# Patient Record
Sex: Male | Born: 1970 | Race: Black or African American | Hispanic: No | Smoking: Never smoker
Health system: Southern US, Community
[De-identification: ages and names within clinical notes are randomized; demographics above are authoritative.]

## PROBLEM LIST (undated history)

## (undated) DIAGNOSIS — K5792 Diverticulitis of intestine, part unspecified, without perforation or abscess without bleeding: Secondary | ICD-10-CM

## (undated) DIAGNOSIS — G4730 Sleep apnea, unspecified: Secondary | ICD-10-CM

## (undated) DIAGNOSIS — T7840XA Allergy, unspecified, initial encounter: Secondary | ICD-10-CM

## (undated) DIAGNOSIS — I10 Essential (primary) hypertension: Secondary | ICD-10-CM

## (undated) DIAGNOSIS — H269 Unspecified cataract: Secondary | ICD-10-CM

## (undated) DIAGNOSIS — E785 Hyperlipidemia, unspecified: Secondary | ICD-10-CM

## (undated) DIAGNOSIS — E669 Obesity, unspecified: Secondary | ICD-10-CM

## (undated) HISTORY — DX: Essential (primary) hypertension: I10

## (undated) HISTORY — DX: Allergy, unspecified, initial encounter: T78.40XA

## (undated) HISTORY — DX: Hyperlipidemia, unspecified: E78.5

## (undated) HISTORY — DX: Diverticulitis of intestine, part unspecified, without perforation or abscess without bleeding: K57.92

## (undated) HISTORY — DX: Unspecified cataract: H26.9

## (undated) HISTORY — DX: Sleep apnea, unspecified: G47.30

## (undated) HISTORY — DX: Obesity, unspecified: E66.9

---

## 1998-05-19 ENCOUNTER — Emergency Department (HOSPITAL_COMMUNITY): Admission: EM | Admit: 1998-05-19 | Discharge: 1998-05-19 | Payer: Self-pay

## 1998-11-02 ENCOUNTER — Emergency Department (HOSPITAL_COMMUNITY): Admission: EM | Admit: 1998-11-02 | Discharge: 1998-11-02 | Payer: Self-pay | Admitting: Emergency Medicine

## 2000-05-01 ENCOUNTER — Emergency Department (HOSPITAL_COMMUNITY): Admission: EM | Admit: 2000-05-01 | Discharge: 2000-05-01 | Payer: Self-pay | Admitting: Emergency Medicine

## 2002-04-27 ENCOUNTER — Emergency Department (HOSPITAL_COMMUNITY): Admission: EM | Admit: 2002-04-27 | Discharge: 2002-04-28 | Payer: Self-pay | Admitting: Emergency Medicine

## 2003-10-20 ENCOUNTER — Emergency Department (HOSPITAL_COMMUNITY): Admission: EM | Admit: 2003-10-20 | Discharge: 2003-10-20 | Payer: Self-pay

## 2005-08-30 ENCOUNTER — Emergency Department (HOSPITAL_COMMUNITY): Admission: EM | Admit: 2005-08-30 | Discharge: 2005-08-30 | Payer: Self-pay | Admitting: Emergency Medicine

## 2006-08-05 ENCOUNTER — Emergency Department (HOSPITAL_COMMUNITY): Admission: EM | Admit: 2006-08-05 | Discharge: 2006-08-06 | Payer: Self-pay | Admitting: Emergency Medicine

## 2008-02-16 ENCOUNTER — Emergency Department (HOSPITAL_COMMUNITY): Admission: EM | Admit: 2008-02-16 | Discharge: 2008-02-16 | Payer: Self-pay | Admitting: Emergency Medicine

## 2010-02-04 ENCOUNTER — Ambulatory Visit: Payer: Self-pay | Admitting: Gastroenterology

## 2011-05-06 LAB — GC/CHLAMYDIA PROBE AMP, GENITAL
Chlamydia, DNA Probe: NEGATIVE
GC Probe Amp, Genital: POSITIVE — AB

## 2011-07-31 ENCOUNTER — Encounter: Payer: Self-pay | Admitting: *Deleted

## 2011-07-31 ENCOUNTER — Emergency Department (HOSPITAL_COMMUNITY)
Admission: EM | Admit: 2011-07-31 | Discharge: 2011-08-01 | Disposition: A | Discharge status: Home / Self Care | Payer: Self-pay | Attending: Emergency Medicine | Admitting: Emergency Medicine

## 2011-07-31 ENCOUNTER — Emergency Department (HOSPITAL_COMMUNITY): Payer: Self-pay

## 2011-07-31 DIAGNOSIS — K5732 Diverticulitis of large intestine without perforation or abscess without bleeding: Secondary | ICD-10-CM | POA: Insufficient documentation

## 2011-07-31 DIAGNOSIS — R10819 Abdominal tenderness, unspecified site: Secondary | ICD-10-CM | POA: Insufficient documentation

## 2011-07-31 DIAGNOSIS — K5792 Diverticulitis of intestine, part unspecified, without perforation or abscess without bleeding: Secondary | ICD-10-CM

## 2011-07-31 DIAGNOSIS — R1032 Left lower quadrant pain: Secondary | ICD-10-CM | POA: Insufficient documentation

## 2011-07-31 LAB — COMPREHENSIVE METABOLIC PANEL
Alkaline Phosphatase: 66 U/L (ref 39–117)
BUN: 16 mg/dL (ref 6–23)
CO2: 30 mEq/L (ref 19–32)
GFR calc Af Amer: 82 mL/min — ABNORMAL LOW (ref >90)
GFR calc non Af Amer: 71 mL/min — ABNORMAL LOW (ref >90)
Glucose, Bld: 93 mg/dL (ref 70–99)
Total Bilirubin: 0.9 mg/dL (ref 0.3–1.2)
Total Protein: 7.9 g/dL (ref 6.0–8.3)

## 2011-07-31 LAB — DIFFERENTIAL
Basophils Relative: 0 % (ref 0–1)
Eosinophils Absolute: 0.2 10*3/uL (ref 0.0–0.7)
Eosinophils Relative: 2 % (ref 0–5)
Lymphocytes Relative: 15 % (ref 12–46)
Neutro Abs: 8.5 10*3/uL — ABNORMAL HIGH (ref 1.7–7.7)

## 2011-07-31 LAB — CBC
HCT: 40.8 % (ref 39.0–52.0)
MCHC: 32.6 g/dL (ref 30.0–36.0)
MCV: 87 fL (ref 78.0–100.0)
RBC: 4.69 MIL/uL (ref 4.22–5.81)
RDW: 13.5 % (ref 11.5–15.5)

## 2011-07-31 LAB — URINALYSIS, ROUTINE W REFLEX MICROSCOPIC
Bilirubin Urine: NEGATIVE
Hgb urine dipstick: NEGATIVE
Leukocytes, UA: NEGATIVE
Nitrite: NEGATIVE
Urobilinogen, UA: 1 mg/dL (ref 0.0–1.0)

## 2011-07-31 LAB — LIPASE, BLOOD: Lipase: 29 U/L (ref 11–59)

## 2011-07-31 MED ORDER — IOHEXOL 300 MG/ML  SOLN: 100.0000 mL | Freq: Once | INTRAMUSCULAR | Status: AC | PRN

## 2011-07-31 MED ORDER — MORPHINE SULFATE 4 MG/ML IJ SOLN
4.0000 mg | Freq: Once | INTRAMUSCULAR | Status: AC
  Filled 2011-07-31: qty 1

## 2011-07-31 NOTE — ED Notes (Signed)
Pt noticed a gas like pain in his LLQ yesterday which has gotten progressively worse throughout the night and into today.  Pt is stating that his stool is more soft than usual.  Pt has tenderness upon mild palpation of LLQ and states that the pain is the worst in his life.

## 2011-07-31 NOTE — ED Provider Notes (Signed)
History     CSN: [id number]  Arrival date & time 07/31/11  4098   First MD Initiated Contact with Patient 07/31/11 2135      Chief Complaint  Patient presents with  . Abdominal Pain    LLQ, tender to palpation.    (Consider location/radiation/quality/duration/timing/severity/associated sxs/prior treatment) HPI Comments: Patient reports that he began having LLQ and LUQ pain yesterday morning.  The pain has gotten progressively worse.  He describes the pain as a constant sharp pain.  He reports that the pain is worse when he moves and when he ambulates.  No fever.  He reports that his last BM was at approximately 5pm this evening.  Pain did not improve after bowel movement.  He denies diarrhea, but states that his bowel movements have been softer than usual.    Patient is a 40 y.o. male presenting with abdominal pain.  Abdominal Pain The primary symptoms of the illness include abdominal pain, diarrhea and hematochezia. The primary symptoms of the illness do not include fever, shortness of breath, nausea, vomiting, hematemesis or dysuria.  Additional symptoms associated with the illness include chills. Symptoms associated with the illness do not include anorexia, diaphoresis, heartburn, constipation, urgency, hematuria, frequency or back pain.    History reviewed. No pertinent past medical history.  History reviewed. No pertinent past surgical history.  History reviewed. No pertinent family history.  History  Substance Use Topics  . Smoking status: Not on file  . Smokeless tobacco: Not on file  . Alcohol Use: 4.2 oz/week    3 Cans of beer, 4 Shots of liquor per week      Review of Systems  Constitutional: Positive for chills. Negative for fever and diaphoresis.  HENT: Negative for neck pain and neck stiffness.   Respiratory: Negative for chest tightness and shortness of breath.   Cardiovascular: Negative for chest pain.  Gastrointestinal: Positive for abdominal pain,  diarrhea, blood in stool and hematochezia. Negative for heartburn, nausea, vomiting, constipation, anorexia and hematemesis.  Genitourinary: Negative for dysuria, urgency, frequency and hematuria.  Musculoskeletal: Negative for back pain.  Skin: Negative for rash.  Neurological: Negative for dizziness, seizures, syncope, light-headedness and headaches.    Allergies  Review of patient's allergies indicates no known allergies.  Home Medications   Current Outpatient Rx  Name Route Sig Dispense Refill  . CIPROFLOXACIN HCL 500 MG PO TABS Oral Take 1 tablet (500 mg total) by mouth 2 (two) times daily. 28 tablet 0  . HYDROCODONE-ACETAMINOPHEN 5-325 MG PO TABS Oral Take 2 tablets by mouth every 4 (four) hours as needed for pain. 10 tablet 0  . METRONIDAZOLE 500 MG PO TABS Oral Take 1 tablet (500 mg total) by mouth 3 (three) times daily. 42 tablet 0    BP 140/72  Pulse 70  Temp(Src) 98.8 F (37.1 C) (Oral)  Resp 18  SpO2 99%  Physical Exam  Nursing note and vitals reviewed. Constitutional: He is oriented to person, place, and time. He appears well-developed and well-nourished. No distress.  HENT:  Head: Normocephalic and atraumatic.  Neck: Normal range of motion. Neck supple.  Cardiovascular: Normal rate, regular rhythm and normal heart sounds.   Pulmonary/Chest: Effort normal and breath sounds normal. No respiratory distress. He has no wheezes.  Abdominal: He exhibits no mass. There is tenderness in the left upper quadrant and left lower quadrant. There is no rebound, no guarding and no CVA tenderness.       Tenderness of the LLQ when palpating the RLQ  Neurological: He is alert and oriented to person, place, and time.  Skin: Skin is warm and dry. He is not diaphoretic.  Psychiatric: He has a normal mood and affect.    ED Course  Procedures (including critical care time)  Labs Reviewed  CBC - Abnormal; Notable for the following:    WBC 11.3 (*)    All other components within  normal limits  DIFFERENTIAL - Abnormal; Notable for the following:    Neutro Abs 8.5 (*)    All other components within normal limits  COMPREHENSIVE METABOLIC PANEL - Abnormal; Notable for the following:    GFR calc non Af Amer 71 (*)    GFR calc Af Amer 82 (*)    All other components within normal limits  URINALYSIS, ROUTINE W REFLEX MICROSCOPIC - Abnormal; Notable for the following:    Ketones, ur TRACE (*)    All other components within normal limits  LIPASE, BLOOD  LAB REPORT - SCANNED  SPECIMEN HOLD   No results found.   1. Diverticulitis       MDM  Patient afebrile.  VSS.  CT scan showing diverticulitis.  No perforation or abscess.  Therefore, feel that patient can be discharged home with outpatient antibiotic therapy.  Patient given Rx for Metronidazole and Cipro.        Pascal Lux [facility] 08/03/11 1345

## 2011-08-01 MED ORDER — IOHEXOL 300 MG/ML  SOLN
100.0000 mL | Freq: Once | INTRAMUSCULAR | Status: AC | PRN
  Administered 2011-08-01: 100 mL via INTRAVENOUS

## 2011-08-01 MED ORDER — CIPROFLOXACIN HCL 500 MG PO TABS: 500.0000 mg | ORAL_TABLET | Freq: Two times a day (BID) | ORAL | Status: AC

## 2011-08-01 MED ORDER — HYDROCODONE-ACETAMINOPHEN 5-325 MG PO TABS: 2.0000 | ORAL_TABLET | ORAL | Status: AC | PRN

## 2011-08-01 MED ORDER — METRONIDAZOLE 500 MG PO TABS: 500.0000 mg | ORAL_TABLET | Freq: Three times a day (TID) | ORAL | Status: AC

## 2011-08-03 NOTE — ED Provider Notes (Signed)
Medical screening examination/treatment/procedure(s) were performed by non-physician practitioner and as supervising physician I was immediately available for consultation/collaboration.   Geraldin Habermehl A Margaret Staggs, MD 08/03/11 2325 

## 2012-02-11 ENCOUNTER — Emergency Department (HOSPITAL_COMMUNITY)
Admission: EM | Admit: 2012-02-11 | Discharge: 2012-02-11 | Disposition: A | Discharge status: Home / Self Care | Payer: No Typology Code available for payment source | Attending: Emergency Medicine | Admitting: Emergency Medicine

## 2012-02-11 ENCOUNTER — Encounter (HOSPITAL_COMMUNITY): Payer: Self-pay | Admitting: *Deleted

## 2012-02-11 ENCOUNTER — Emergency Department (HOSPITAL_COMMUNITY): Payer: No Typology Code available for payment source

## 2012-02-11 DIAGNOSIS — Y998 Other external cause status: Secondary | ICD-10-CM | POA: Insufficient documentation

## 2012-02-11 DIAGNOSIS — Y93I9 Activity, other involving external motion: Secondary | ICD-10-CM | POA: Insufficient documentation

## 2012-02-11 DIAGNOSIS — IMO0002 Reserved for concepts with insufficient information to code with codable children: Secondary | ICD-10-CM | POA: Insufficient documentation

## 2012-02-11 DIAGNOSIS — S86912A Strain of unspecified muscle(s) and tendon(s) at lower leg level, left leg, initial encounter: Secondary | ICD-10-CM

## 2012-02-11 DIAGNOSIS — Y92410 Unspecified street and highway as the place of occurrence of the external cause: Secondary | ICD-10-CM | POA: Insufficient documentation

## 2012-02-11 MED ORDER — IBUPROFEN 600 MG PO TABS: 600.0000 mg | ORAL_TABLET | Freq: Four times a day (QID) | ORAL | Status: AC | PRN

## 2012-02-11 NOTE — ED Provider Notes (Signed)
Medical screening examination/treatment/procedure(s) were performed by non-physician practitioner and as supervising physician I was immediately available for consultation/collaboration.   Lyanne Co, MD 02/11/12 [phone]
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
## 2012-09-08 ENCOUNTER — Emergency Department (HOSPITAL_COMMUNITY)
Admission: EM | Admit: 2012-09-08 | Discharge: 2012-09-09 | Disposition: A | Discharge status: Home / Self Care | Payer: Self-pay | Attending: Emergency Medicine | Admitting: Emergency Medicine

## 2012-09-08 ENCOUNTER — Encounter (HOSPITAL_COMMUNITY): Payer: Self-pay | Admitting: Emergency Medicine

## 2012-09-08 DIAGNOSIS — R059 Cough, unspecified: Secondary | ICD-10-CM | POA: Insufficient documentation

## 2012-09-08 DIAGNOSIS — R509 Fever, unspecified: Secondary | ICD-10-CM | POA: Insufficient documentation

## 2012-09-08 DIAGNOSIS — IMO0001 Reserved for inherently not codable concepts without codable children: Secondary | ICD-10-CM | POA: Insufficient documentation

## 2012-09-08 DIAGNOSIS — J069 Acute upper respiratory infection, unspecified: Secondary | ICD-10-CM

## 2012-09-08 DIAGNOSIS — R6889 Other general symptoms and signs: Secondary | ICD-10-CM

## 2012-09-08 DIAGNOSIS — J3489 Other specified disorders of nose and nasal sinuses: Secondary | ICD-10-CM | POA: Insufficient documentation

## 2012-09-08 DIAGNOSIS — R05 Cough: Secondary | ICD-10-CM | POA: Insufficient documentation

## 2012-09-08 MED ORDER — IPRATROPIUM BROMIDE 0.03 % NA SOLN: 2.0000 | Freq: Two times a day (BID) | NASAL | Status: DC

## 2012-09-08 MED ORDER — AMOXICILLIN-POT CLAVULANATE 875-125 MG PO TABS: 1.0000 | ORAL_TABLET | Freq: Two times a day (BID) | ORAL | Status: DC

## 2012-09-08 MED ORDER — GUAIFENESIN ER 600 MG PO TB12: 1200.0000 mg | ORAL_TABLET | Freq: Two times a day (BID) | ORAL | Status: DC

## 2012-09-08 MED ORDER — OXYMETAZOLINE HCL 0.05 % NA SOLN
1.0000 | Freq: Once | NASAL | Status: AC
  Administered 2012-09-09: 1 via NASAL
  Filled 2012-09-08: qty 15

## 2012-09-08 NOTE — ED Notes (Signed)
PA at bedside.

## 2012-09-08 NOTE — ED Notes (Signed)
Pt c/o sinus pressure, body aches, and chills.  Appears to be in NAD.

## 2012-09-08 NOTE — ED Provider Notes (Signed)
Medical screening examination/treatment/procedure(s) were performed by non-physician practitioner and as supervising physician I was immediately available for consultation/collaboration.   Dione Booze, MD 09/08/12 [phone]

## 2012-09-08 NOTE — ED Provider Notes (Signed)
History   This chart was scribed for non-physician practitioner working with Dione Booze, MD by Gerlean Ren, ED Scribe. This patient was seen in room WTR9/WTR9 and the patient's care was started at 11:05 PM.    CSN: [id number]  Arrival date & time 09/08/12  2201   First MD Initiated Contact with Patient 09/08/12 2234      Chief Complaint  Patient presents with  . Facial Pain    The history is provided by the patient and medical records. No language interpreter was used.   Eddie Wu is a 42 y.o. male who presents to the Emergency Department complaining of sinus pressure and pain with associated HA, myalgias, fevers, chills, and cough all with gradual onset this morning and gradually worsening since.  Pt reports nasal congestion that is green when blown out that began yesterday, preceding all other symptoms.  Pt also reports nausea present 2 days ago that has since resolved.  Pt denies emesis, diarrhea, otalgia, sore throat.  Pt states that all of these symptoms are similar in type to h/o previous sinusitis 15-20 years ago, however pt denies any sinus infections since that time.  No known sick contacts.  No chronic medical conditions.  No regular medications.  Nothing makes it better and nothing makes it worse.  History reviewed. No pertinent past medical history.  History reviewed. No pertinent past surgical history.  History reviewed. No pertinent family history.  History  Substance Use Topics  . Smoking status: Not on file  . Smokeless tobacco: Not on file  . Alcohol Use: 4.2 oz/week    3 Cans of beer, 4 Shots of liquor per week      Review of Systems  Constitutional: Positive for fever and chills. Negative for diaphoresis, appetite change, fatigue and unexpected weight change.  HENT: Positive for congestion and sinus pressure. Negative for ear pain, sore throat, mouth sores and neck stiffness.   Eyes: Negative for visual disturbance.  Respiratory: Positive for cough.  Negative for chest tightness, shortness of breath and wheezing.   Cardiovascular: Negative for chest pain.  Gastrointestinal: Negative for nausea, vomiting, abdominal pain, diarrhea and constipation.  Genitourinary: Negative for dysuria, urgency, frequency and hematuria.  Musculoskeletal: Positive for myalgias. Negative for back pain.  Skin: Negative for rash.  Neurological: Positive for headaches. Negative for syncope and light-headedness.  Hematological: Does not bruise/bleed easily.  Psychiatric/Behavioral: Negative for sleep disturbance. The patient is not nervous/anxious.   All other systems reviewed and are negative.    Allergies  Review of patient's allergies indicates no known allergies.  Home Medications   Current Outpatient Rx  Name  Route  Sig  Dispense  Refill  . FLINSTONES GUMMIES OMEGA-3 DHA PO   Oral   Take 1 tablet by mouth daily.         . AMOXICILLIN-POT CLAVULANATE 875-125 MG PO TABS   Oral   Take 1 tablet by mouth every 12 (twelve) hours.   14 tablet   0   . GUAIFENESIN ER 600 MG PO TB12   Oral   Take 2 tablets (1,200 mg total) by mouth 2 (two) times daily.   30 tablet   0   . IPRATROPIUM BROMIDE 0.03 % NA SOLN   Nasal   Place 2 sprays into the nose 2 (two) times daily. PRN congestion   30 mL   0     BP 142/66  Pulse 74  Temp 98.3 F (36.8 C) (Oral)  SpO2 98%  Physical  Exam  Nursing note and vitals reviewed. Constitutional: He appears well-developed and well-nourished. No distress.  HENT:  Head: Normocephalic and atraumatic.  Mouth/Throat: Oropharynx is clear and moist. No oropharyngeal exudate.       Bilateral TMs normal Mild tonsillar adenopathy, mild tonsillar edema, mild erythema in oropharynx, no evidence of tonsillar abscess, no uvula swelling, maxillary sinuses tender to percussion, frontal sinuses non-tender to percussion   Eyes: Conjunctivae normal are normal. Pupils are equal, round, and reactive to light. No scleral icterus.   Neck: Normal range of motion. Neck supple.  Cardiovascular: Normal rate, regular rhythm, normal heart sounds and intact distal pulses.   Pulmonary/Chest: Effort normal. No respiratory distress. He has no wheezes.       Breath sounds coarse but equal with good tidal volume, no rales or rhonchi  Abdominal: Soft. Bowel sounds are normal. He exhibits no mass. There is no tenderness. There is no rebound and no guarding.  Musculoskeletal: Normal range of motion. He exhibits no edema.  Neurological: He is alert.       Speech is clear and goal oriented Moves extremities without ataxia  Skin: Skin is warm and dry. He is not diaphoretic.  Psychiatric: He has a normal mood and affect.    ED Course  Procedures (including critical care time) DIAGNOSTIC STUDIES: Oxygen Saturation is 98% on room air, normal by my interpretation.    COORDINATION OF CARE: 11:13 PM- Patient informed of clinical course, understands medical decision-making process, and agrees with plan.  1. Viral URI   2. Flu-like symptoms       MDM  Henderson Cloud presents with URI symptoms for 24 hrs. No indication for CXR as symptoms are < 24 hours, unlikely PNA.  Patients symptoms are consistent with URI, likely viral etiology. Discussed that antibiotics are not indicated for viral infections; however pt is adamant that this is a sinus infection and he needs an abx.  Pt will be discharged with symptomatic treatment in addition.  Verbalizes understanding and is agreeable with plan. Pt is hemodynamically stable & in NAD prior to dc.  1. Medications: augmentin, atrovent, mucinex, usual home medications  2. Treatment: rest, drink plenty of fluids, take medication as prescribed  3. Follow Up: Please followup with your primary doctor for discussion of your diagnoses and further evaluation after today's visit; if you do not have a primary care doctor use the resource guide provided to find one  I personally performed the services  described in this documentation, which was scribed in my presence. The recorded information has been reviewed and is accurate.      Dahlia Client Mallisa [place], PA-C 09/08/12 [phone]

## 2012-09-09 MED ORDER — ACETAMINOPHEN 325 MG PO TABS
650.0000 mg | ORAL_TABLET | Freq: Once | ORAL | Status: AC
  Administered 2012-09-09: 650 mg via ORAL
  Filled 2012-09-09: qty 2

## 2013-05-24 ENCOUNTER — Emergency Department (HOSPITAL_COMMUNITY)
Admission: EM | Admit: 2013-05-24 | Discharge: 2013-05-24 | Disposition: A | Discharge status: Home / Self Care | Payer: 59 | Attending: Emergency Medicine | Admitting: Emergency Medicine

## 2013-05-24 ENCOUNTER — Encounter (HOSPITAL_COMMUNITY): Payer: Self-pay | Admitting: Emergency Medicine

## 2013-05-24 DIAGNOSIS — Z79899 Other long term (current) drug therapy: Secondary | ICD-10-CM | POA: Insufficient documentation

## 2013-05-24 DIAGNOSIS — Z202 Contact with and (suspected) exposure to infections with a predominantly sexual mode of transmission: Secondary | ICD-10-CM | POA: Insufficient documentation

## 2013-05-24 MED ORDER — METRONIDAZOLE 500 MG PO TABS: 500.0000 mg | ORAL_TABLET | Freq: Two times a day (BID) | ORAL | Status: DC

## 2013-05-24 MED ORDER — AZITHROMYCIN 250 MG PO TABS
1000.0000 mg | ORAL_TABLET | Freq: Once | ORAL | Status: AC
  Administered 2013-05-24: 1000 mg via ORAL
  Filled 2013-05-24: qty 4

## 2013-05-24 MED ORDER — CEFTRIAXONE SODIUM 250 MG IJ SOLR
250.0000 mg | Freq: Once | INTRAMUSCULAR | Status: AC
Start: 2013-05-24 — End: 2013-05-24
  Administered 2013-05-24: 250 mg via INTRAMUSCULAR
  Filled 2013-05-24: qty 250
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
## 2013-05-24 NOTE — ED Notes (Signed)
Pt states for the past 2 days now he has had testicle discomfort denies any swelling, denies any urinary discomfort,

## 2013-05-24 NOTE — ED Notes (Signed)
Supplies for GC/Chlamydia swab at bedside.

## 2013-05-24 NOTE — ED Provider Notes (Signed)
CSN: [id number]     Arrival date & time 05/24/13  1341 History   First MD Initiated Contact with Patient 05/24/13 1356     Chief Complaint  Patient presents with  . Penis Pain   (Consider location/radiation/quality/duration/timing/severity/associated sxs/prior Treatment) HPI Comments: Patient presents with a chief complaint of an itchy feeling of his penis that has been present for the past 3 days.  He is concerned that he may have a STD.  He reports that his sexual partner was recently diagnosed with Trichomoniasis.  He denies any scrotal pain or swelling.  Denies penile discharge.  Denies abdominal pain, nausea, vomiting, fever, or chills.  He has not noticed a rash or any lesions of the penis or scrotal area.  Patient is a 42 y.o. male presenting with penile pain. The history is provided by the patient.  Penis Pain    History reviewed. No pertinent past medical history. History reviewed. No pertinent past surgical history. History reviewed. No pertinent family history. History  Substance Use Topics  . Smoking status: Never Smoker   . Smokeless tobacco: Not on file  . Alcohol Use: 4.2 oz/week    3 Cans of beer, 4 Shots of liquor per week     Comment: occasionally    Review of Systems  Genitourinary: Positive for penile pain. Negative for dysuria, urgency, frequency, hematuria, flank pain, discharge, penile swelling, scrotal swelling, difficulty urinating and testicular pain.  All other systems reviewed and are negative.    Allergies  Review of patient's allergies indicates no known allergies.  Home Medications   Current Outpatient Rx  Name  Route  Sig  Dispense  Refill  . guaiFENesin (MUCINEX) 600 MG 12 hr tablet   Oral   Take 2 tablets (1,200 mg total) by mouth 2 (two) times daily.   30 tablet   0    BP 125/102  Pulse 67  Temp(Src) 98.1 F (36.7 C) (Oral)  Resp 17  SpO2 96% Physical Exam  Nursing note and vitals reviewed. Constitutional: He appears  well-developed and well-nourished.  HENT:  Head: Normocephalic and atraumatic.  Neck: Normal range of motion. Neck supple.  Cardiovascular: Normal rate, regular rhythm and normal heart sounds.   Pulmonary/Chest: Effort normal and breath sounds normal.  Abdominal: Soft. Bowel sounds are normal. He exhibits no distension and no mass. There is no tenderness. There is no rebound and no guarding.  Genitourinary: Penis normal. Right testis shows no mass, no swelling and no tenderness. Right testis is descended. Left testis shows no mass, no swelling and no tenderness. Left testis is descended. Circumcised. No penile erythema. No discharge found.  Chaperone present during exam  Neurological: He is alert.  Skin: Skin is warm and dry.  Psychiatric: He has a normal mood and affect.    ED Course  Procedures (including critical care time) Labs Review Labs Reviewed  GC/CHLAMYDIA PROBE AMP   Imaging Review No results found.  EKG Interpretation   None       MDM  No diagnosis found. Patient presents with concern that he may have a STD.  Sexual partner recently diagnosed with Trichomoniasis.  No scrotal pain or swelling on exam.  No lesions or discharge present.  GC/Chlamydia pending.  Patient given prophylactic treatment with Ceftriaxone and Azithromycin.  Patient treated with 7 day course of Flagyl due to recent exposure to Trich.  Patient stable for discharge.      Pascal Lux [place], PA-C 05/24/13 [phone]

## 2013-05-25 LAB — GC/CHLAMYDIA PROBE AMP
CT Probe RNA: NEGATIVE
GC Probe RNA: NEGATIVE

## 2013-05-26 NOTE — ED Provider Notes (Signed)
Medical screening examination/treatment/procedure(s) were performed by non-physician practitioner and as supervising physician I was immediately available for consultation/collaboration.   Suzi Roots, MD 05/26/13 [phone]

## 2013-07-04 IMAGING — CT CT ABD-PELV W/ CM
2 of 4 series · 17 of 46 positions shown, 19 images · IV contrast (APPLIED)
Comparison: None.

CLINICAL DATA: Left lower quadrant pain.

CT ABDOMEN AND PELVIS WITH CONTRAST
TECHNIQUE: Multidetector CT imaging of the abdomen and pelvis was
performed following the standard protocol during bolus
administration of intravenous contrast.
Contrast:  100 ml [id number]-M77

[Series 2: abd/pel with · axial · 0.87mm/px · z∈[-497,-102]mm · 14 of 87 slices shown, 16 images]
[im 4/87  soft-tissue]
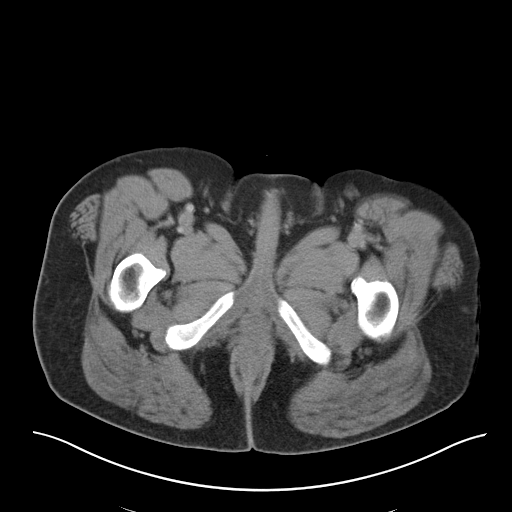
[im 4/87  bone]
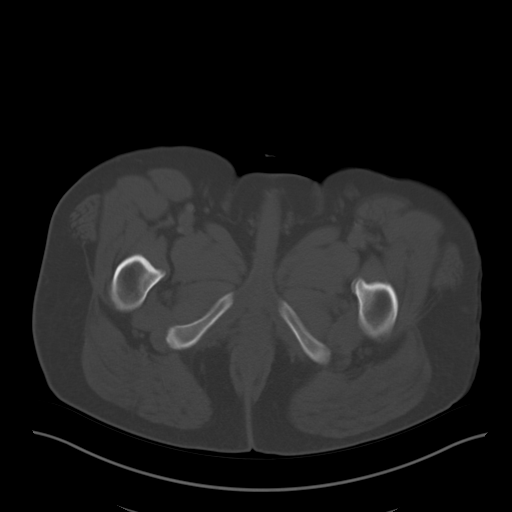
[im 11/87  soft-tissue]
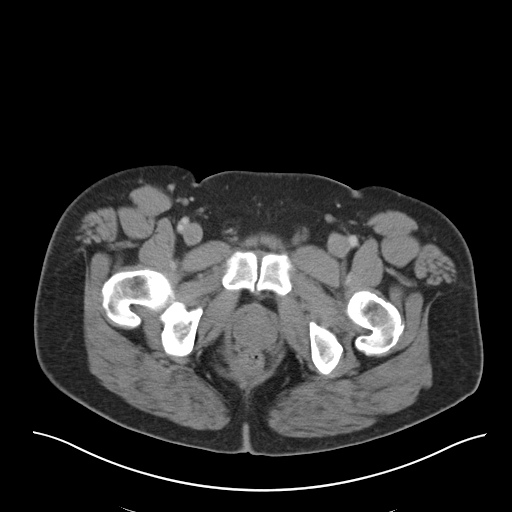
[im 18/87  soft-tissue]
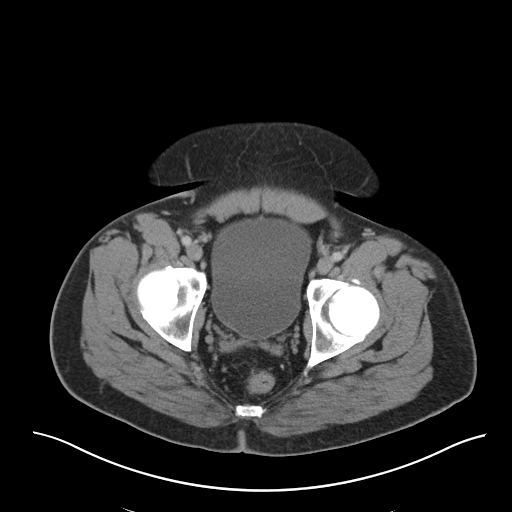
[im 25/87  soft-tissue]
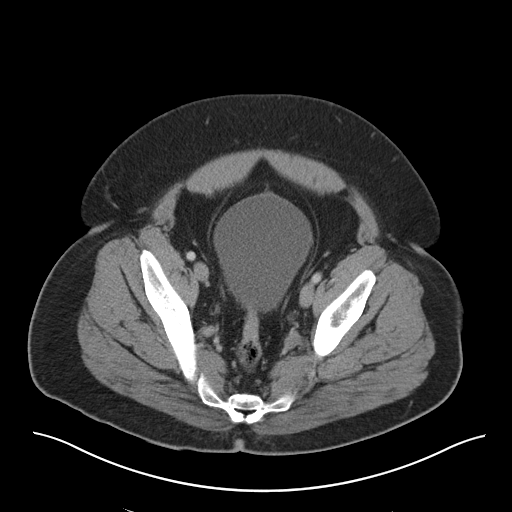
[im 28/87  soft-tissue]
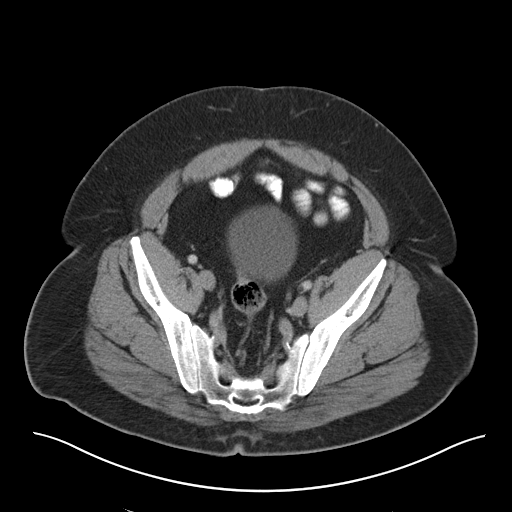
[im 35/87  soft-tissue]
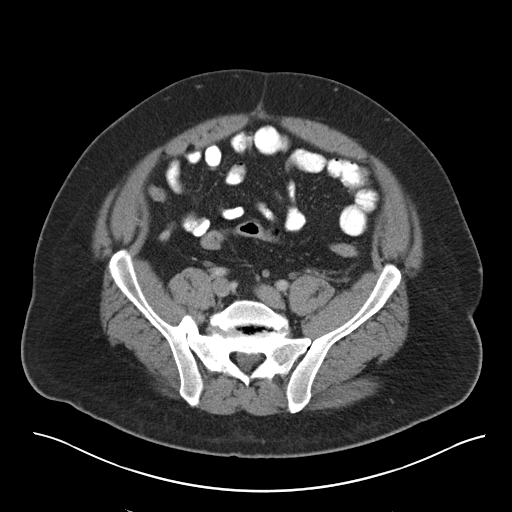
[im 42/87  soft-tissue]
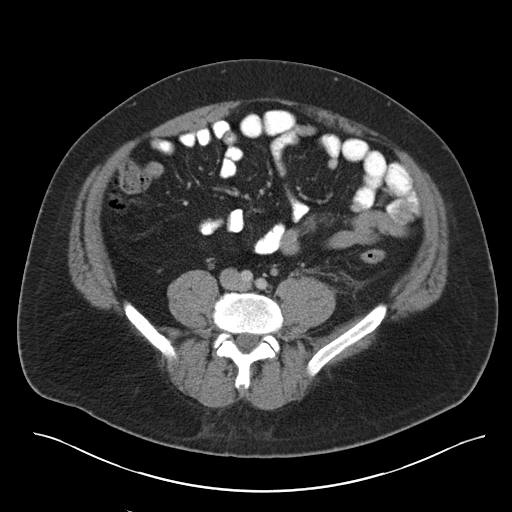
[im 45/87  soft-tissue]
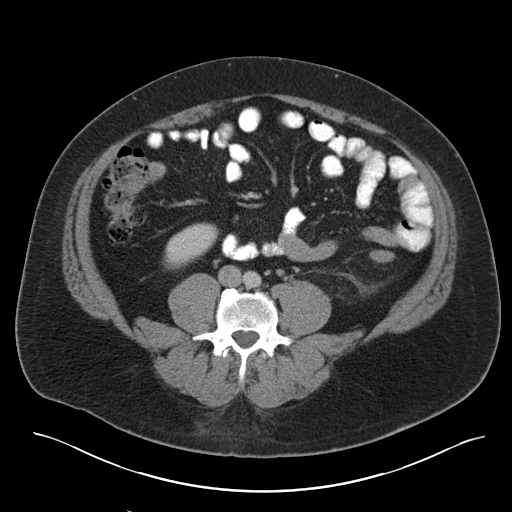
[im 52/87  soft-tissue]
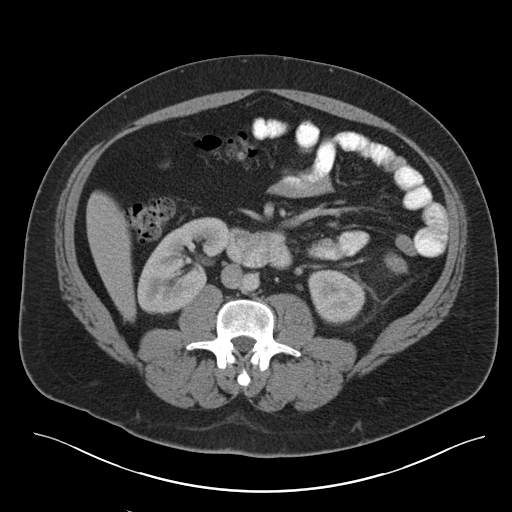
[im 52/87  bone]
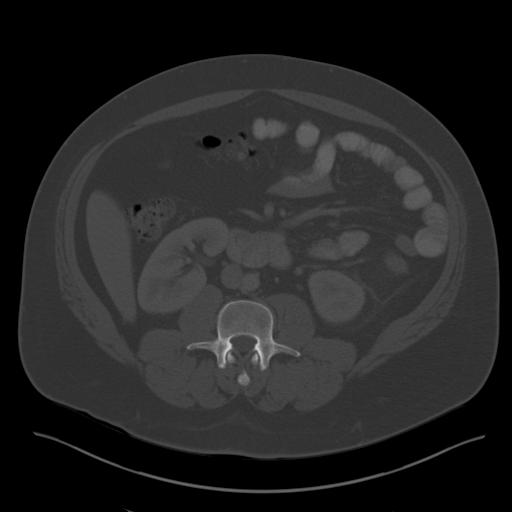
[im 59/87  soft-tissue]
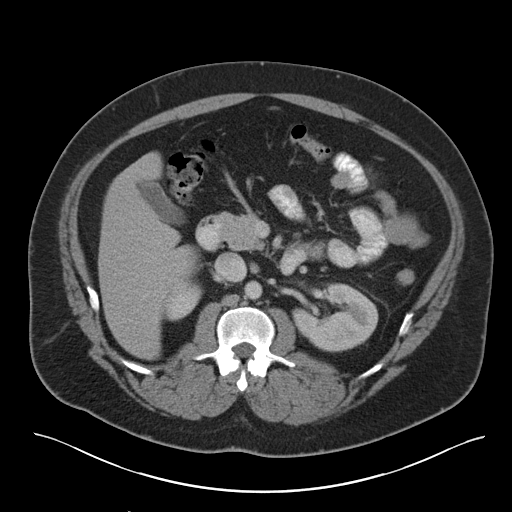
[im 66/87  soft-tissue]
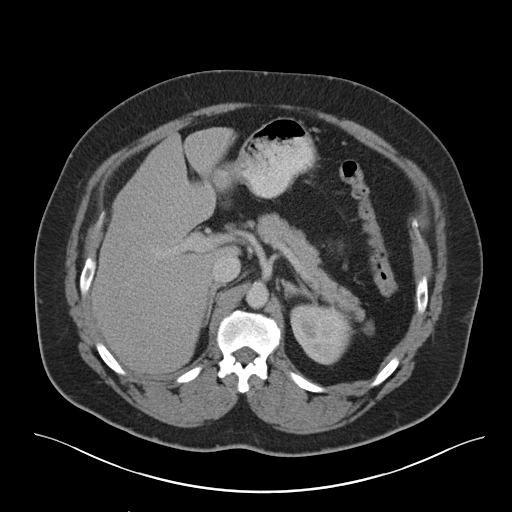
[im 69/87  soft-tissue]
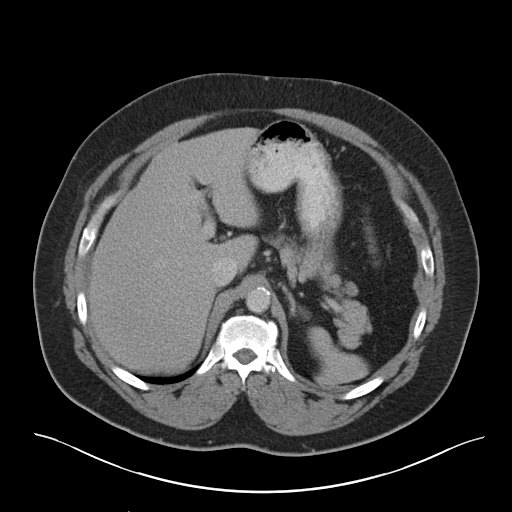
[im 76/87  soft-tissue]
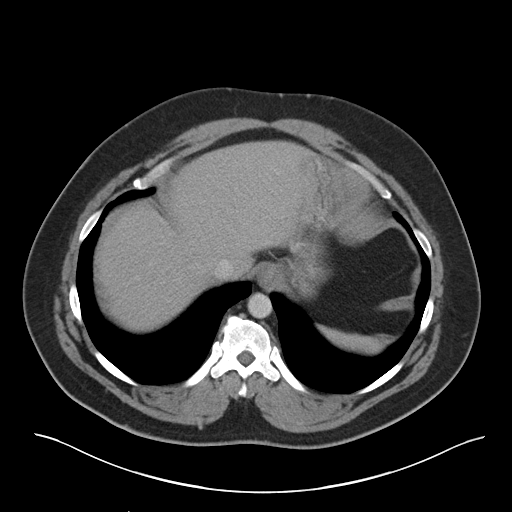
[im 83/87  soft-tissue]
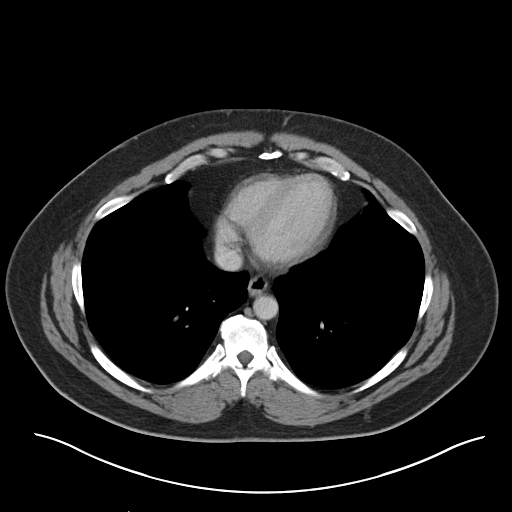

[Series 5: coronal · coronal · 0.79mm/px · 3 of 65 slices shown]
[im 22/65  soft-tissue]
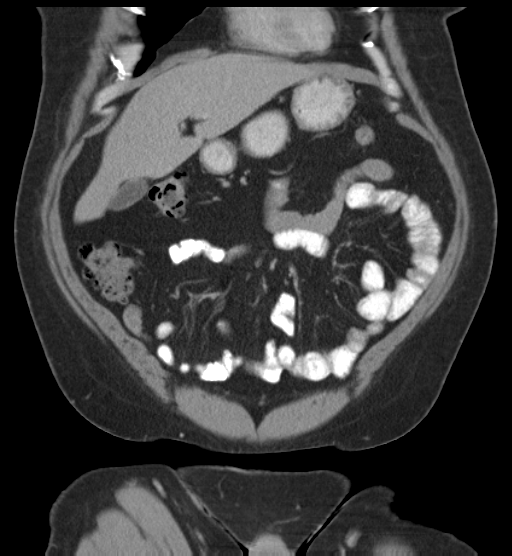
[im 29/65  soft-tissue]
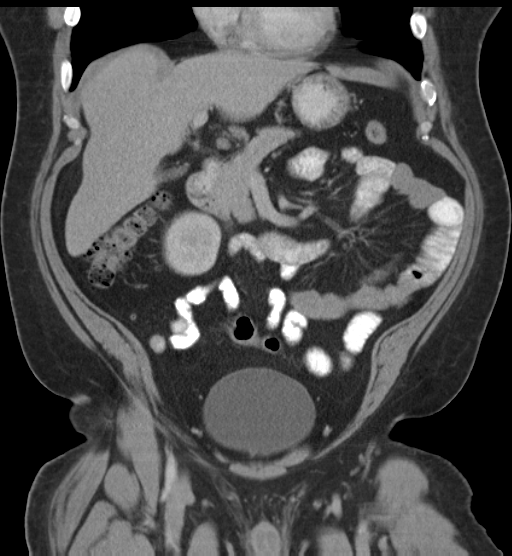
[im 36/65  soft-tissue]
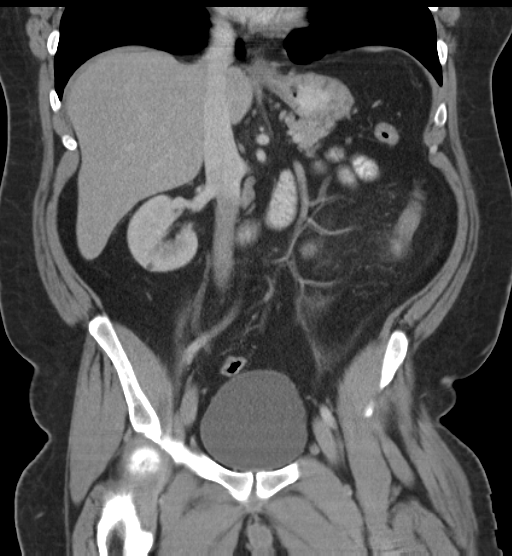

[17 of 46 positions shown; findings below may reference images not displayed]

FINDINGS: The liver and spleen are normal.  Stomach, duodenum,
pancreas, gallbladder, adrenal glands, and kidneys have normal
imaging features.

No abdominal aortic aneurysm.  No free fluid or lymphadenopathy in
the abdomen.

In the mid descending colon, there is some pericolonic
edema/inflammation which appears to be associated with an isolated
posterior diverticulum.  Imaging features are compatible with
diverticulitis.  No perforation or abscess.

Imaging through the pelvis shows no free intraperitoneal fluid.
Bladder is mildly distended.  There is some other scattered a few
diverticuli in the sigmoid colon.  Terminal ileum and appendix are
normal.

Bone windows reveal no worrisome lytic or sclerotic osseous
lesions.  Bilateral pars interarticularis defects are seen at L5.
IMPRESSION: Diverticulitis of the mid descending colon at the level just
cranial to the left iliac crest.  There is no evidence for
associated perforation or abscess.

## 2013-10-22 DIAGNOSIS — R350 Frequency of micturition: Secondary | ICD-10-CM | POA: Insufficient documentation

## 2013-10-22 DIAGNOSIS — R3915 Urgency of urination: Secondary | ICD-10-CM | POA: Insufficient documentation

## 2013-10-22 DIAGNOSIS — Z79899 Other long term (current) drug therapy: Secondary | ICD-10-CM | POA: Insufficient documentation

## 2013-10-22 DIAGNOSIS — R109 Unspecified abdominal pain: Secondary | ICD-10-CM | POA: Insufficient documentation

## 2013-10-22 DIAGNOSIS — Z791 Long term (current) use of non-steroidal anti-inflammatories (NSAID): Secondary | ICD-10-CM | POA: Insufficient documentation

## 2013-10-22 DIAGNOSIS — M549 Dorsalgia, unspecified: Secondary | ICD-10-CM | POA: Insufficient documentation

## 2013-10-22 DIAGNOSIS — R110 Nausea: Secondary | ICD-10-CM | POA: Insufficient documentation

## 2013-10-23 ENCOUNTER — Encounter (HOSPITAL_COMMUNITY): Payer: Self-pay | Admitting: Emergency Medicine

## 2013-10-23 ENCOUNTER — Emergency Department (HOSPITAL_COMMUNITY)
Admission: EM | Admit: 2013-10-23 | Discharge: 2013-10-23 | Disposition: A | Discharge status: Home / Self Care | Payer: 59 | Attending: Emergency Medicine | Admitting: Emergency Medicine

## 2013-10-23 DIAGNOSIS — M549 Dorsalgia, unspecified: Secondary | ICD-10-CM

## 2013-10-23 LAB — URINALYSIS, ROUTINE W REFLEX MICROSCOPIC
BILIRUBIN URINE: NEGATIVE
Glucose, UA: NEGATIVE mg/dL
Hgb urine dipstick: NEGATIVE
Ketones, ur: NEGATIVE mg/dL
Leukocytes, UA: NEGATIVE
NITRITE: NEGATIVE
PH: 6 (ref 5.0–8.0)
PROTEIN: NEGATIVE mg/dL
Specific Gravity, Urine: 1.029 (ref 1.005–1.030)
Urobilinogen, UA: 1 mg/dL (ref 0.0–1.0)

## 2013-10-23 MED ORDER — LIDOCAINE 5 % EX PTCH: 1.0000 | MEDICATED_PATCH | CUTANEOUS | Status: DC

## 2013-10-23 MED ORDER — METHOCARBAMOL 500 MG PO TABS: 500.0000 mg | ORAL_TABLET | Freq: Two times a day (BID) | ORAL | Status: DC

## 2013-10-23 MED ORDER — KETOROLAC TROMETHAMINE 60 MG/2ML IM SOLN
60.0000 mg | Freq: Once | INTRAMUSCULAR | Status: AC
  Administered 2013-10-23: 60 mg via INTRAMUSCULAR
  Filled 2013-10-23: qty 2

## 2013-10-23 MED ORDER — IBUPROFEN 800 MG PO TABS: 800.0000 mg | ORAL_TABLET | Freq: Three times a day (TID) | ORAL | Status: DC

## 2013-10-23 NOTE — ED Notes (Signed)
Pt complains of low back pain that radiates around to the left side, no injury noted and pt states that anti inflammatories and ice or heat does not work, he thinks it may be something else.

## 2013-10-23 NOTE — ED Provider Notes (Signed)
CSN: [id number]     Arrival date & time 10/22/13  2205 History   First MD Initiated Contact with Patient 10/23/13 0051     Chief Complaint  Patient presents with  . Back Pain     (Consider location/radiation/quality/duration/timing/severity/associated sxs/prior Treatment) HPI Comments: Patient is a 43 year old male presented to the emergency department for one week of colicky left-sided back pain. He describes his pain as intermittently sharp and stabbing. States currently his pain is only moderate in nature. He states he has tried anti-inflammatory pain medications, ice, heat without any relief in his pain. He states he cannot find a comfortable position. He endorses associated urinary frequency and urgency but denies any dysuria or hematuria. He does endorse an occasional episode of nausea with this pain. He denies any fevers, history of personal cancer, history of IV drug use. No history of any back surgeries in the past.  Patient is a 43 y.o. male presenting with back pain.  Back Pain Associated symptoms: no abdominal pain, no chest pain, no dysuria and no fever     History reviewed. No pertinent past medical history. History reviewed. No pertinent past surgical history. History reviewed. No pertinent family history. History  Substance Use Topics  . Smoking status: Never Smoker   . Smokeless tobacco: Not on file  . Alcohol Use: 4.2 oz/week    3 Cans of beer, 4 Shots of liquor per week     Comment: occasionally    Review of Systems  Constitutional: Negative for fever.  Respiratory: Negative for shortness of breath.   Cardiovascular: Negative for chest pain.  Gastrointestinal: Positive for nausea. Negative for vomiting and abdominal pain.  Genitourinary: Positive for urgency, frequency and flank pain. Negative for dysuria, hematuria, discharge, penile swelling, scrotal swelling, penile pain and testicular pain.  Musculoskeletal: Positive for back pain.  All other systems  reviewed and are negative.      Allergies  Review of patient's allergies indicates no known allergies.  Home Medications   Current Outpatient Rx  Name  Route  Sig  Dispense  Refill  . ibuprofen (ADVIL,MOTRIN) 800 MG tablet   Oral   Take 1 tablet (800 mg total) by mouth 3 (three) times daily.   21 tablet   0   . lidocaine (LIDODERM) 5 %   Transdermal   Place 1 patch onto the skin daily. Remove & Discard patch within 12 hours or as directed by MD   6 patch   0   . methocarbamol (ROBAXIN) 500 MG tablet   Oral   Take 1 tablet (500 mg total) by mouth 2 (two) times daily.   20 tablet   0    BP 140/75  Pulse 77  Temp(Src) 98.6 F (37 C) (Oral)  Resp 18  Ht 5\' 6"  (1.676 m)  Wt 190 lb (86.183 kg)  BMI 30.68 kg/m2  SpO2 99% Physical Exam  Nursing note and vitals reviewed. Constitutional: He is oriented to person, place, and time. He appears well-developed and well-nourished. No distress.  HENT:  Head: Normocephalic and atraumatic.  Right Ear: External ear normal.  Left Ear: External ear normal.  Nose: Nose normal.  Mouth/Throat: Oropharynx is clear and moist. No oropharyngeal exudate.  Eyes: Conjunctivae and EOM are normal. Pupils are equal, round, and reactive to light.  Neck: Normal range of motion. Neck supple.  Cardiovascular: Normal rate, regular rhythm, normal heart sounds and intact distal pulses.   Pulmonary/Chest: Effort normal and breath sounds normal. No respiratory distress.  Abdominal: Soft. Bowel sounds are normal. He exhibits no distension. There is no tenderness. There is no rigidity, no rebound and no guarding.  Musculoskeletal:       Lumbar back: He exhibits tenderness. He exhibits normal range of motion, no bony tenderness, no swelling, no edema and no deformity.       Back:  Neurological: He is alert and oriented to person, place, and time. He has normal strength. No cranial nerve deficit or sensory deficit. Gait normal. GCS eye subscore is 4. GCS  verbal subscore is 5. GCS motor subscore is 6.  No pronator drift. Bilateral heel-knee-shin intact.  Skin: Skin is warm and dry. He is not diaphoretic.    ED Course  Procedures (including critical care time) Medications  ketorolac (TORADOL) injection 60 mg (60 mg Intramuscular Given 10/23/13 0134)    Labs Review Labs Reviewed  URINE CULTURE  URINALYSIS, ROUTINE W REFLEX MICROSCOPIC   Imaging Review No results found.   EKG Interpretation None      MDM   Final diagnoses:  Back pain    Filed Vitals:   10/23/13 0055  BP: 140/75  Pulse: 77  Temp: 98.6 F (37 C)  Resp: 18    Afebrile, NAD, non-toxic appearing, AAOx4.  Patient with back pain.  No neurological deficits and normal neuro exam.  Urine negative no leukocytes, red blood cells, nitrate negative. Patient can walk but states is painful.  No loss of bowel or bladder control.  No concern for cauda equina.  No fever, night sweats, weight loss, h/o cancer, IVDU.  RICE protocol and pain medicine indicated and discussed with patient. Return precautions discussed. Patient agreeable to plan. Patient stable at time of discharge.     Harlow Mares, PA-C 10/23/13 [phone]

## 2013-10-23 NOTE — Discharge Instructions (Signed)
Please follow up with your primary care physician in 1-2 days. If you do not have one please call the [facility] number listed above. Please take pain medication and/or muscle relaxants as prescribed and as needed for pain. Please do not drive on narcotic pain medication or on muscle relaxants. Please take Motrin as prescribed. Please use lidoderm patches as prescribed. Please read all discharge instructions and return precautions.    Back Pain, Adult Low back pain is very common. About 1 in 5 people have back pain.The cause of low back pain is rarely dangerous. The pain often gets better over time.About half of people with a sudden onset of back pain feel better in just 2 weeks. About 8 in 10 people feel better by 6 weeks.  CAUSES Some common causes of back pain include:  Strain of the muscles or ligaments supporting the spine.  Wear and tear (degeneration) of the spinal discs.  Arthritis.  Direct injury to the back. DIAGNOSIS Most of the time, the direct cause of low back pain is not known.However, back pain can be treated effectively even when the exact cause of the pain is unknown.Answering your caregiver's questions about your overall health and symptoms is one of the most accurate ways to make sure the cause of your pain is not dangerous. If your caregiver needs more information, he or she may order lab work or imaging tests (X-rays or MRIs).However, even if imaging tests show changes in your back, this usually does not require surgery. HOME CARE INSTRUCTIONS For many people, back pain returns.Since low back pain is rarely dangerous, it is often a condition that people can learn to [facility] their own.   Remain active. It is stressful on the back to sit or stand in one place. Do not sit, drive, or stand in one place for more than 30 minutes at a time. Take short walks on level surfaces as soon as pain allows.Try to increase the length of time you walk each  day.  Do not stay in bed.Resting more than 1 or 2 days can delay your recovery.  Do not avoid exercise or work.Your body is made to move.It is not dangerous to be active, even though your back may hurt.Your back will likely heal faster if you return to being active before your pain is gone.  Pay attention to your body when you bend and lift. Many people have less discomfortwhen lifting if they bend their knees, keep the load close to their bodies,and avoid twisting. Often, the most comfortable positions are those that put less stress on your recovering back.  Find a comfortable position to sleep. Use a firm mattress and lie on your side with your knees slightly bent. If you lie on your back, put a pillow under your knees.  Only take over-the-counter or prescription medicines as directed by your caregiver. Over-the-counter medicines to reduce pain and inflammation are often the most helpful.Your caregiver may prescribe muscle relaxant drugs.These medicines help dull your pain so you can more quickly return to your normal activities and healthy exercise.  Put ice on the injured area.  Put ice in a plastic bag.  Place a towel between your skin and the bag.  Leave the ice on for 15-20 minutes, 03-04 times a day for the first 2 to 3 days. After that, ice and heat may be alternated to reduce pain and spasms.  Ask your caregiver about trying back exercises and gentle massage. This may be of  some benefit.  Avoid feeling anxious or stressed.Stress increases muscle tension and can worsen back pain.It is important to recognize when you are anxious or stressed and learn ways to manage it.Exercise is a great option. SEEK MEDICAL CARE IF:  You have pain that is not relieved with rest or medicine.  You have pain that does not improve in 1 week.  You have new symptoms.  You are generally not feeling well. SEEK IMMEDIATE MEDICAL CARE IF:   You have pain that radiates from your back into  your legs.  You develop new bowel or bladder control problems.  You have unusual weakness or numbness in your arms or legs.  You develop nausea or vomiting.  You develop abdominal pain.  You feel faint. Document Released: 07/26/2005 Document Revised: 01/25/2012 Document Reviewed: 12/14/2010 [facility] Patient Information 2014 [place], [place].

## 2013-10-24 LAB — URINE CULTURE
Colony Count: NO GROWTH
Culture: NO GROWTH

## 2013-10-26 NOTE — ED Provider Notes (Signed)
Medical screening examination/treatment/procedure(s) were performed by non-physician practitioner and as supervising physician I was immediately available for consultation/collaboration.  Babette Relic, MD 10/26/13 1037

## 2013-12-14 ENCOUNTER — Emergency Department (INDEPENDENT_AMBULATORY_CARE_PROVIDER_SITE_OTHER): Payer: 59

## 2013-12-14 ENCOUNTER — Emergency Department (HOSPITAL_COMMUNITY): Payer: 59

## 2013-12-14 ENCOUNTER — Encounter (HOSPITAL_COMMUNITY): Payer: Self-pay | Admitting: Emergency Medicine

## 2013-12-14 ENCOUNTER — Emergency Department (INDEPENDENT_AMBULATORY_CARE_PROVIDER_SITE_OTHER)
Admission: EM | Admit: 2013-12-14 | Discharge: 2013-12-14 | Disposition: A | Discharge status: Home / Self Care | Payer: 59 | Source: Home / Self Care | Attending: Emergency Medicine | Admitting: Emergency Medicine

## 2013-12-14 DIAGNOSIS — IMO0002 Reserved for concepts with insufficient information to code with codable children: Secondary | ICD-10-CM

## 2013-12-14 DIAGNOSIS — M7989 Other specified soft tissue disorders: Secondary | ICD-10-CM

## 2013-12-14 DIAGNOSIS — S46819A Strain of other muscles, fascia and tendons at shoulder and upper arm level, unspecified arm, initial encounter: Secondary | ICD-10-CM

## 2013-12-14 DIAGNOSIS — M79609 Pain in unspecified limb: Secondary | ICD-10-CM

## 2013-12-14 DIAGNOSIS — S43499A Other sprain of unspecified shoulder joint, initial encounter: Secondary | ICD-10-CM

## 2013-12-14 DIAGNOSIS — M79603 Pain in arm, unspecified: Secondary | ICD-10-CM

## 2013-12-14 DIAGNOSIS — Y929 Unspecified place or not applicable: Secondary | ICD-10-CM

## 2013-12-14 DIAGNOSIS — S46319A Strain of muscle, fascia and tendon of triceps, unspecified arm, initial encounter: Secondary | ICD-10-CM
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
## 2013-12-14 NOTE — Progress Notes (Signed)
Orthopedic Tech Progress Note Patient Details:  Eddie Wu 09/03/1970 [id number]  Ortho Devices Type of Ortho Device: Ace wrap;Arm sling;Post (long arm) splint Ortho Device/Splint Location: LUE Ortho Device/Splint Interventions: Ordered;Application   Braulio Bosch 12/14/2013, 5:04 PM

## 2013-12-14 NOTE — Discharge Instructions (Signed)
Tendon Injury Tendons are strong, cordlike structures that connect muscle to bone. Tendons are made up of woven fibers, like a rope. A tendon injury is a tear (rupture) of the tendon. The rupture may be partial (only a few of the fibers in your tendon rupture) or complete (your entire tendon ruptures). CAUSES  Tendon injuries can be caused by high-stress activities, such as sports. They also can be caused by a repetitive injury or by a single injury from an excessive, rapid force. SYMPTOMS  Symptoms of tendon injury include pain when you move the joint close to the tendon. Other symptoms are swelling, redness, and warmth. DIAGNOSIS  Tendon injuries often can be diagnosed by physical exam. However, sometimes an X-ray exam or advanced imaging, such as magnetic resonance imaging (MRI), is necessary to determine the extent of the injury. TREATMENT  Partial tendon ruptures often can be treated with immobilization. A splint, bandage, or removable brace usually is used to immobilize the injured tendon. Most injured tendons need to be immobilized for 1 2 months before they are completely healed. Complete tendon ruptures may require surgical reattachment. Document Released: 09/02/2004 Document Revised: 07/15/2011 Document Reviewed: 10/17/2011 [facility] Patient Information 2014 [place].
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
## 2013-12-14 NOTE — ED Notes (Signed)
Ortho has been called.

## 2013-12-15 NOTE — ED Provider Notes (Signed)
Medical screening examination/treatment/procedure(s) were performed by non-physician practitioner and as supervising physician I was immediately available for consultation/collaboration.  Philipp Deputy, M.D.  Harden Mo, MD 12/15/13 [phone]

## 2014-06-27 ENCOUNTER — Emergency Department (HOSPITAL_COMMUNITY)
Admission: EM | Admit: 2014-06-27 | Discharge: 2014-06-27 | Disposition: A | Discharge status: Home / Self Care | Payer: 59 | Attending: Emergency Medicine | Admitting: Emergency Medicine

## 2014-06-27 ENCOUNTER — Encounter (HOSPITAL_COMMUNITY): Payer: Self-pay | Admitting: Emergency Medicine

## 2014-06-27 DIAGNOSIS — Z791 Long term (current) use of non-steroidal anti-inflammatories (NSAID): Secondary | ICD-10-CM | POA: Insufficient documentation

## 2014-06-27 DIAGNOSIS — M62830 Muscle spasm of back: Secondary | ICD-10-CM

## 2014-06-27 DIAGNOSIS — Z79899 Other long term (current) drug therapy: Secondary | ICD-10-CM | POA: Insufficient documentation

## 2014-06-27 DIAGNOSIS — R109 Unspecified abdominal pain: Secondary | ICD-10-CM | POA: Insufficient documentation

## 2014-06-27 LAB — URINALYSIS, ROUTINE W REFLEX MICROSCOPIC
Bilirubin Urine: NEGATIVE
GLUCOSE, UA: NEGATIVE mg/dL
Hgb urine dipstick: NEGATIVE
Ketones, ur: NEGATIVE mg/dL
LEUKOCYTES UA: NEGATIVE
NITRITE: NEGATIVE
Protein, ur: NEGATIVE mg/dL
SPECIFIC GRAVITY, URINE: 1.007 (ref 1.005–1.030)
Urobilinogen, UA: 0.2 mg/dL (ref 0.0–1.0)
pH: 6 (ref 5.0–8.0)

## 2014-06-27 LAB — I-STAT CHEM 8, ED
BUN: 25 mg/dL — ABNORMAL HIGH (ref 6–23)
CALCIUM ION: 1.15 mmol/L (ref 1.12–1.23)
Chloride: 103 mEq/L (ref 96–112)
Creatinine, Ser: 1.1 mg/dL (ref 0.50–1.35)
Glucose, Bld: 88 mg/dL (ref 70–99)
HEMATOCRIT: 41 % (ref 39.0–52.0)
Hemoglobin: 13.9 g/dL (ref 13.0–17.0)
Potassium: 4 mEq/L (ref 3.7–5.3)
Sodium: 138 mEq/L (ref 137–147)
TCO2: 24 mmol/L (ref 0–100)

## 2014-06-27 MED ORDER — CYCLOBENZAPRINE HCL 10 MG PO TABS: 10.0000 mg | ORAL_TABLET | Freq: Three times a day (TID) | ORAL | Status: DC | PRN

## 2014-06-27 MED ORDER — IBUPROFEN 600 MG PO TABS: 600.0000 mg | ORAL_TABLET | Freq: Four times a day (QID) | ORAL | Status: DC | PRN

## 2014-06-27 NOTE — ED Notes (Signed)
Patient sitting up in chair Labs obtained Patient appears in NAD

## 2014-06-27 NOTE — ED Notes (Addendum)
Pt c/o bilat flank pain x 7 days, increased urination,denies n/v/d.

## 2014-06-27 NOTE — ED Provider Notes (Addendum)
CSN: [id number]     Arrival date & time 06/27/14  0619 History   First MD Initiated Contact with Patient 06/27/14 0703     Chief Complaint  Patient presents with  . Flank Pain     (Consider location/radiation/quality/duration/timing/severity/associated sxs/prior Treatment) Patient is a 43 y.o. male presenting with flank pain. The history is provided by the patient.  Flank Pain This is a new problem. The current episode started more than 1 week ago. The problem occurs constantly. The problem has been gradually worsening. Pertinent negatives include no abdominal pain and no shortness of breath. Associated symptoms comments: After doing a new exercise at the gym he started to have some back pain but has worsened over the last week and noticed mild frequency in urination.  No fever, n/v/d, dysuria, penile d/c, rashes. The symptoms are aggravated by bending and twisting. Nothing relieves the symptoms. He has tried nothing for the symptoms. The treatment provided no relief.    History reviewed. No pertinent past medical history. History reviewed. No pertinent past surgical history. No family history on file. History  Substance Use Topics  . Smoking status: Never Smoker   . Smokeless tobacco: Not on file  . Alcohol Use: 4.2 oz/week    3 Cans of beer, 4 Shots of liquor per week     Comment: occasionally    Review of Systems  Constitutional: Negative for fever.  Respiratory: Negative for shortness of breath.   Gastrointestinal: Negative for nausea, vomiting, abdominal pain, diarrhea and constipation.  Genitourinary: Positive for frequency and flank pain. Negative for dysuria, urgency, discharge, difficulty urinating, penile pain and testicular pain.  Musculoskeletal: Positive for back pain.  Skin: Negative for rash.  All other systems reviewed and are negative.     Allergies  Review of patient's allergies indicates no known allergies.  Home Medications   Prior to Admission  medications   Medication Sig Start Date End Date Taking? Authorizing Provider  ibuprofen (ADVIL,MOTRIN) 800 MG tablet Take 1 tablet (800 mg total) by mouth 3 (three) times daily. 10/23/13   Jennifer L Piepenbrink, PA-C  lidocaine (LIDODERM) 5 % Place 1 patch onto the skin daily. Remove & Discard patch within 12 hours or as directed by MD 10/23/13   Stephani Police Piepenbrink, PA-C  methocarbamol (ROBAXIN) 500 MG tablet Take 1 tablet (500 mg total) by mouth 2 (two) times daily. 10/23/13   Jennifer L Piepenbrink, PA-C   BP 146/77 mmHg  Pulse 74  Temp(Src) 97.9 F (36.6 C) (Oral)  Resp 18  Ht 5\' 7"  (1.702 m)  Wt 190 lb (86.183 kg)  BMI 29.75 kg/m2  SpO2 98% Physical Exam  Constitutional: He is oriented to person, place, and time. He appears well-developed and well-nourished. No distress.  HENT:  Head: Normocephalic and atraumatic.  Mouth/Throat: Oropharynx is clear and moist.  Eyes: Conjunctivae and EOM are normal. Pupils are equal, round, and reactive to light.  Neck: Normal range of motion. Neck supple.  Cardiovascular: Normal rate, regular rhythm and intact distal pulses.   No murmur heard. Pulmonary/Chest: Effort normal and breath sounds normal. No respiratory distress. He has no wheezes. He has no rales.  Abdominal: Soft. He exhibits no distension. There is no tenderness. There is no rebound and no guarding.  Musculoskeletal: Normal range of motion. He exhibits tenderness. He exhibits no edema.       Lumbar back: He exhibits tenderness and pain. He exhibits normal range of motion, no bony tenderness, no swelling and no deformity.  Back:  Neurological: He is alert and oriented to person, place, and time.  Skin: Skin is warm and dry. No rash noted. No erythema.  Psychiatric: He has a normal mood and affect. His behavior is normal.  Nursing note and vitals reviewed.   ED Course  Procedures (including critical care time) Labs Review Labs Reviewed  I-STAT CHEM 8, ED - Abnormal;  Notable for the following:    BUN 25 (*)    All other components within normal limits  URINALYSIS, ROUTINE W REFLEX MICROSCOPIC    Imaging Review No results found.   EKG Interpretation None      MDM   Final diagnoses:  Muscle spasm of back    Patient presents today with approximately 7 days of bilateral back pain with mild urinary frequency. He denies any dysuria, urgency, fever, abdominal pain, penile discharge. He does have 2 sexual partners and denies using regular protection.  Patient states that he didn't exercise at the gym shortly before the pain started however the pain is not improving and he he is concerned that it's different than muscle pain.  Possibility for UTI versus muscular pain.  Pain reproducible on exam. Low suspicion for STD as patient has no urethral discharge and partners are assymptomatic  7:47 AM Labs normal.  Feel musculoskeletal.  Treated with nsaids and muscle relaxer.  Blanchie Dessert, MD 06/27/14 [place], MD 06/27/14 [phone]

## 2014-06-28 LAB — GC/CHLAMYDIA PROBE AMP
CT PROBE, AMP APTIMA: NEGATIVE
GC PROBE AMP APTIMA: NEGATIVE

## 2017-08-09 HISTORY — PX: POLYPECTOMY: SHX149

## 2017-08-09 HISTORY — PX: COLONOSCOPY: SHX174

## 2017-10-13 ENCOUNTER — Encounter: Payer: Self-pay | Admitting: Gastroenterology

## 2017-11-28 ENCOUNTER — Encounter: Payer: Self-pay | Admitting: Gastroenterology

## 2017-11-28 ENCOUNTER — Ambulatory Visit: Payer: Self-pay | Admitting: Gastroenterology

## 2017-11-28 VITALS — BP 132/70 | HR 76 | Ht 65.75 in | Wt 201.4 lb

## 2017-11-28 DIAGNOSIS — K921 Melena: Secondary | ICD-10-CM

## 2017-11-28 MED ORDER — NA SULFATE-K SULFATE-MG SULF 17.5-3.13-1.6 GM/177ML PO SOLN: 1.0000 | Freq: Once | ORAL | 0 refills | Status: AC

## 2017-11-28 NOTE — Progress Notes (Signed)
History of Present Illness: This is a 47 year old male referred by Cleda Mccreedy, MD for the evaluation of rectal bleeding.  He relates an episode of rectal bleeding about 1 month ago and it resolved.  He has no other gastrointestinal complaints.  He had diverticulitis one time in 2012 and was treated with oral antibiotics and symptoms resolved.  Deferred to colonoscopy denies weight loss, abdominal pain, constipation, diarrhea, change in stool caliber, melena, nausea, vomiting, dysphagia, reflux symptoms, chest pain.   Abd/pelvic CT 07/2011 IMPRESSION: Diverticulitis of the mid descending colon at the level just cranial to the left iliac crest.  There is no evidence for associated perforation or abscess.   No Known Allergies Outpatient Medications Prior to Visit  Medication Sig Dispense Refill  . amLODipine (NORVASC) 5 MG tablet Take 5 mg by mouth daily.  1  . hydrochlorothiazide (HYDRODIURIL) 25 MG tablet Take 25 mg by mouth daily.  1  . cyclobenzaprine (FLEXERIL) 10 MG tablet Take 1 tablet (10 mg total) by mouth 3 (three) times daily as needed for muscle spasms. 20 tablet 0  . ibuprofen (ADVIL,MOTRIN) 600 MG tablet Take 1 tablet (600 mg total) by mouth every 6 (six) hours as needed. 20 tablet 0  . lidocaine (LIDODERM) 5 % Place 1 patch onto the skin daily. Remove & Discard patch within 12 hours or as directed by MD 6 patch 0  . methocarbamol (ROBAXIN) 500 MG tablet Take 1 tablet (500 mg total) by mouth 2 (two) times daily. 20 tablet 0   No facility-administered medications prior to visit.    Past Medical History:  Diagnosis Date  . Diverticulitis   . Hypertension   . Obesity    History reviewed. No pertinent surgical history. Social History   Socioeconomic History  . Marital status: Divorced    Spouse name: Not on file  . Number of children: Not on file  . Years of education: Not on file  . Highest education level: Not on file  Occupational History  . Not on file  Social  Needs  . Financial resource strain: Not on file  . Food insecurity:    Worry: Not on file    Inability: Not on file  . Transportation needs:    Medical: Not on file    Non-medical: Not on file  Tobacco Use  . Smoking status: Never Smoker  . Smokeless tobacco: Never Used  Substance and Sexual Activity  . Alcohol use: Yes    Alcohol/week: 4.2 oz    Types: 3 Cans of beer, 4 Shots of liquor per week    Comment: occasionally  . Drug use: Yes    Types: Marijuana  . Sexual activity: Yes  Lifestyle  . Physical activity:    Days per week: Not on file    Minutes per session: Not on file  . Stress: Not on file  Relationships  . Social connections:    Talks on phone: Not on file    Gets together: Not on file    Attends religious service: Not on file    Active member of club or organization: Not on file    Attends meetings of clubs or organizations: Not on file    Relationship status: Not on file  Other Topics Concern  . Not on file  Social History Narrative  . Not on file   History reviewed. No pertinent family history.    Review of Systems: Pertinent positive and negative review of systems were noted  in the above HPI section. All other review of systems were otherwise negative.    Physical Exam: General: Well developed, well nourished, no acute distress Head: Normocephalic and atraumatic Eyes:  sclerae anicteric, EOMI Ears: Normal auditory acuity Mouth: No deformity or lesions Neck: Supple, no masses or thyromegaly Lungs: Clear throughout to auscultation Heart: Regular rate and rhythm; no murmurs, rubs or bruits Abdomen: Soft, non tender and non distended. No masses, hepatosplenomegaly or hernias noted. Normal Bowel sounds Rectal: Deferred to colonoscopy Musculoskeletal: Symmetrical with no gross deformities  Skin: No lesions on visible extremities Pulses:  Normal pulses noted Extremities: No clubbing, cyanosis, edema or deformities noted Neurological: Alert oriented x  4, grossly nonfocal Cervical Nodes:  No significant cervical adenopathy Inguinal Nodes: No significant inguinal adenopathy Psychological:  Alert and cooperative. Normal mood and affect  Assessment and Recommendations:  1.  Self-limited hematochezia.  Rule out hemorrhoids, colorectal neoplasms and other disorders.  Schedule colonoscopy. The risks (including bleeding, perforation, infection, missed lesions, medication reactions and possible hospitalization or surgery if complications occur), benefits, and alternatives to colonoscopy with possible biopsy and possible polypectomy were discussed with the patient and they consent to proceed.   2.  History of diverticulitis.  Long-term high-fiber diet with adequate daily water intake.   cc: Cleda Mccreedy, MD

## 2017-11-28 NOTE — Patient Instructions (Signed)
You have been scheduled for a colonoscopy. Please follow written instructions given to you at your visit today.  Please pick up your prep supplies at the pharmacy within the next 1-3 days. If you use inhalers (even only as needed), please bring them with you on the day of your procedure. Your physician has requested that you go to www.startemmi.com and enter the access code given to you at your visit today. This web site gives a general overview about your procedure. However, you should still follow specific instructions given to you by our office regarding your preparation for the procedure.  Normal BMI (Body Mass Index- based on height and weight) is between 19 and 25. Your BMI today is Body mass index is 32.75 kg/m. Marland Kitchen Please consider follow up  regarding your BMI with your Primary Care Provider.  Thank you for choosing me and [facility].  Pricilla Riffle. Dagoberto Ligas., MD., Marval Regal

## 2017-12-26 ENCOUNTER — Other Ambulatory Visit: Payer: Self-pay

## 2017-12-26 ENCOUNTER — Ambulatory Visit (AMBULATORY_SURGERY_CENTER): Payer: Self-pay | Admitting: Gastroenterology

## 2017-12-26 ENCOUNTER — Encounter: Payer: Self-pay | Admitting: Gastroenterology

## 2017-12-26 VITALS — BP 103/62 | HR 68 | Temp 99.3°F | Resp 11 | Ht 65.75 in | Wt 201.0 lb

## 2017-12-26 DIAGNOSIS — K921 Melena: Secondary | ICD-10-CM

## 2017-12-26 DIAGNOSIS — D124 Benign neoplasm of descending colon: Secondary | ICD-10-CM

## 2017-12-26 MED ORDER — SODIUM CHLORIDE 0.9 % IV SOLN: 500.0000 mL | Freq: Once | INTRAVENOUS | Status: DC

## 2017-12-26 NOTE — Progress Notes (Signed)
Report to PACU, RN, vss, BBS= Clear.  

## 2017-12-26 NOTE — Patient Instructions (Signed)
Impression/Recommendations:  Polyp handout given Continue present to patient. Diverticulosis handout given to patient. Hemorrhoid handout given to patient. High fiber diet handout given to patient.  Repeat colonoscopy for surveillance.  Date to be determined after pathology results reviewed.  Continue present medications.  YOU HAD AN ENDOSCOPIC PROCEDURE TODAY AT [facility]:   Refer to the procedure report that was given to you for any specific questions about what was found during the examination.  If the procedure report does not answer your questions, please call your gastroenterologist to clarify.  If you requested that your care partner not be given the details of your procedure findings, then the procedure report has been included in a sealed envelope for you to review at your convenience later.  YOU SHOULD EXPECT: Some feelings of bloating in the abdomen. Passage of more gas than usual.  Walking can help get rid of the air that was put into your GI tract during the procedure and reduce the bloating. If you had a lower endoscopy (such as a colonoscopy or flexible sigmoidoscopy) you may notice spotting of blood in your stool or on the toilet paper. If you underwent a bowel prep for your procedure, you may not have a normal bowel movement for a few days.  Please Note:  You might notice some irritation and congestion in your nose or some drainage.  This is from the oxygen used during your procedure.  There is no need for concern and it should clear up in a day or so.  SYMPTOMS TO REPORT IMMEDIATELY:   Following lower endoscopy (colonoscopy or flexible sigmoidoscopy):  Excessive amounts of blood in the stool  Significant tenderness or worsening of abdominal pains  Swelling of the abdomen that is new, acute  Fever of 100F or higher  For urgent or emergent issues, a gastroenterologist can be reached at any hour by calling [phone].   DIET:  We do recommend a  small meal at first, but then you may proceed to your regular diet.  Drink plenty of fluids but you should avoid alcoholic beverages for 24 hours.  ACTIVITY:  You should plan to take it easy for the rest of today and you should NOT DRIVE or use heavy machinery until tomorrow (because of the sedation medicines used during the test).    FOLLOW UP: Our staff will call the number listed on your records the next business day following your procedure to check on you and address any questions or concerns that you may have regarding the information given to you following your procedure. If we do not reach you, we will leave a message.  However, if you are feeling well and you are not experiencing any problems, there is no need to return our call.  We will assume that you have returned to your regular daily activities without incident.  If any biopsies were taken you will be contacted by phone or by letter within the next 1-3 weeks.  Please call us at [phone] if you have not heard about the biopsies in 3 weeks.    SIGNATURES/CONFIDENTIALITY: You and/or your care partner have signed paperwork which will be entered into your electronic medical record.  These signatures attest to the fact that that the information above on your After Visit Summary has been reviewed and is understood.  Full responsibility of the confidentiality of this discharge information lies with you and/or your care-partner.

## 2017-12-26 NOTE — Progress Notes (Signed)
Called to room to assist during endoscopic procedure.  Patient ID and intended procedure confirmed with present staff. Received instructions for my participation in the procedure from the performing physician.  

## 2017-12-26 NOTE — Op Note (Signed)
[facility] Patient Name: Eddie Wu Procedure Date: 12/26/2017 3:07 PM MRN: [id number] Endoscopist: Ladene Artist , MD Age: 47 Referring MD:  Date of Birth: 05-06-1971 Gender: Male Account #: [id number] Procedure:                Colonoscopy Indications:              Hematochezia Medicines:                Monitored Anesthesia Care Procedure:                Pre-Anesthesia Assessment:                           - Prior to the procedure, a History and Physical                            was performed, and patient medications and                            allergies were reviewed. The patient's tolerance of                            previous anesthesia was also reviewed. The risks                            and benefits of the procedure and the sedation                            options and risks were discussed with the patient.                            All questions were answered, and informed consent                            was obtained. Prior Anticoagulants: The patient has                            taken no previous anticoagulant or antiplatelet                            agents. ASA Grade Assessment: II - A patient with                            mild systemic disease. After reviewing the risks                            and benefits, the patient was deemed in                            satisfactory condition to undergo the procedure.                           After obtaining informed consent, the colonoscope  was passed under direct vision. Throughout the                            procedure, the patient's blood pressure, pulse, and                            oxygen saturations were monitored continuously. The                            Colonoscope was introduced through the anus and                            advanced to the the cecum, identified by                            appendiceal orifice and ileocecal valve. The                    ileocecal valve, appendiceal orifice, and rectum                            were photographed. The quality of the bowel                            preparation was good. The colonoscopy was performed                            without difficulty. The patient tolerated the                            procedure well. Scope In: 3:11:34 PM Scope Out: 3:21:09 PM Scope Withdrawal Time: 0 hours 8 minutes 42 seconds  Total Procedure Duration: 0 hours 9 minutes 35 seconds  Findings:                 The perianal and digital rectal examinations were                            normal.                           A 7 mm polyp was found in the descending colon. The                            polyp was sessile. The polyp was removed with a                            cold snare. Resection and retrieval were complete.                           Scattered small and large-mouthed diverticula were                            found in the entire colon. There was no evidence of  diverticular bleeding.                           Internal hemorrhoids were found during                            retroflexion. The hemorrhoids were small and Grade                            I (internal hemorrhoids that do not prolapse).                           The exam was otherwise without abnormality on                            direct and retroflexion views. Complications:            No immediate complications. Estimated blood loss:                            None. Estimated Blood Loss:     Estimated blood loss: none. Impression:               - One 7 mm polyp in the descending colon, removed                            with a cold snare. Resected and retrieved.                           - Scattered diverticulosis in the entire examined                            colon. There was no evidence of diverticular                            bleeding.                           - Internal  hemorrhoids.                           - The examination was otherwise normal on direct                            and retroflexion views. Recommendation:           - Repeat colonoscopy in 5 years for surveillance if                            polyp is precancerous, otherwise 10 years.                           - Patient has a contact number available for                            emergencies. The signs and symptoms of potential  delayed complications were discussed with the                            patient. Return to normal activities tomorrow.                            Written discharge instructions were provided to the                            patient.                           - High fiber diet.                           - Continue present medications.                           - Await pathology results. Ladene Artist, MD 12/26/2017 3:25:37 PM This report has been signed electronically.

## 2017-12-27 ENCOUNTER — Telehealth: Payer: Self-pay | Admitting: *Deleted

## 2017-12-27 NOTE — Telephone Encounter (Signed)
  Follow up Call-  Call back number 12/26/2017  Post procedure Call Back phone  # [phone] cell  Permission to leave phone message Yes  Some recent data might be hidden     Patient questions:  Do you have a fever, pain , or abdominal swelling? No. Pain Score  0 *  Have you tolerated food without any problems? Yes.    Have you been able to return to your normal activities? Yes.    Do you have any questions about your discharge instructions: Diet   No. Medications  No. Follow up visit  No.  Do you have questions or concerns about your Care? No.  Actions: * If pain score is 4 or above: No action needed, pain <4.

## 2018-01-01 ENCOUNTER — Encounter: Payer: Self-pay | Admitting: Gastroenterology

## 2018-08-04 ENCOUNTER — Encounter (HOSPITAL_COMMUNITY): Payer: Self-pay | Admitting: Emergency Medicine

## 2018-08-04 ENCOUNTER — Emergency Department (HOSPITAL_COMMUNITY)
Admission: EM | Admit: 2018-08-04 | Discharge: 2018-08-04 | Disposition: A | Discharge status: Home / Self Care | Payer: Self-pay | Attending: Emergency Medicine | Admitting: Emergency Medicine

## 2018-08-04 DIAGNOSIS — Z79899 Other long term (current) drug therapy: Secondary | ICD-10-CM | POA: Insufficient documentation

## 2018-08-04 DIAGNOSIS — J069 Acute upper respiratory infection, unspecified: Secondary | ICD-10-CM | POA: Insufficient documentation

## 2018-08-04 DIAGNOSIS — I10 Essential (primary) hypertension: Secondary | ICD-10-CM | POA: Insufficient documentation

## 2018-08-04 NOTE — ED Notes (Signed)
Bed: WTR6 Expected date:  Expected time:  Means of arrival:  Comments: 

## 2018-08-04 NOTE — ED Triage Notes (Signed)
Pt c/o that is productive at night and nasal congestion for over week.

## 2018-08-04 NOTE — ED Provider Notes (Signed)
[facility] DEPT Provider Note   CSN: [id number] Arrival date & time: 08/04/18  1038     History   Chief Complaint Chief Complaint  Patient presents with  . Nasal Congestion  . Cough    HPI Eddie Wu is a 47 y.o. male with a PMHx of HTN, who presents to the ED with complaints of URI symptoms that began about 4 weeks ago but then got better until about 3 days ago when they returned.  Symptoms include sore throat, cough with yellowish-greenish sputum production, and nasal congestion.  He has been drinking green tea and orange juice with some relief of his symptoms, alcohol worsens his symptoms.  He mentions multiple sick contacts recently.  He did not receive the flu shot recently.  He denies any fevers, chills, rhinorrhea, drooling, trismus, body aches, CP, SOB, abd pain, N/V/D/C, hematuria, dysuria, myalgias, arthralgias, numbness, tingling, focal weakness, or any other complaints at this time.   The history is provided by the patient and medical records. No language interpreter was used.    Past Medical History:  Diagnosis Date  . Allergy   . Cataract   . Diverticulitis   . Hypertension   . Obesity     There are no active problems to display for this patient.   History reviewed. No pertinent surgical history.      Home Medications    Prior to Admission medications   Medication Sig Start Date End Date Taking? Authorizing Provider  amLODipine (NORVASC) 5 MG tablet Take 5 mg by mouth daily. 11/08/17   [provider]  hydrochlorothiazide (HYDRODIURIL) 25 MG tablet Take 25 mg by mouth daily. 11/08/17   [provider]    Family History Family History  Problem Relation Age of Onset  . Colon cancer Neg Hx   . Esophageal cancer Neg Hx   . Liver cancer Neg Hx   . Pancreatic cancer Neg Hx   . Prostate cancer Neg Hx   . Rectal cancer Neg Hx   . Stomach cancer Neg Hx     Social History Social History   Tobacco  Use  . Smoking status: Never Smoker  . Smokeless tobacco: Never Used  . Tobacco comment: Dip - age 17, quit as a teenager  Substance Use Topics  . Alcohol use: Yes    Alcohol/week: 7.0 standard drinks    Types: 3 Cans of beer, 4 Shots of liquor per week  . Drug use: Yes    Types: Marijuana, Codeine    Comment: smoked marijuana 12-26-17, cocaine 08-2017     Allergies   Patient has no known allergies.   Review of Systems Review of Systems  Constitutional: Negative for chills and fever.  HENT: Positive for congestion and sore throat. Negative for ear pain and rhinorrhea.   Respiratory: Positive for cough. Negative for shortness of breath.   Cardiovascular: Negative for chest pain.  Gastrointestinal: Negative for abdominal pain, constipation, diarrhea, nausea and vomiting.  Genitourinary: Negative for dysuria and hematuria.  Musculoskeletal: Negative for arthralgias and myalgias.  Skin: Negative for color change.  Allergic/Immunologic: Negative for immunocompromised state.  Neurological: Negative for weakness and numbness.  Psychiatric/Behavioral: Negative for confusion.   All other systems reviewed and are negative for acute change except as noted in the HPI.    Physical Exam Updated Vital Signs BP (!) 147/86 (BP Location: Left Arm)   Pulse 68   Temp 98.3 F (36.8 C) (Oral)   Resp 16   SpO2  98%   Physical Exam Vitals signs and nursing note reviewed.  Constitutional:      General: He is not in acute distress.    Appearance: Normal appearance. He is well-developed. He is not toxic-appearing.     Comments: Afebrile, nontoxic, NAD  HENT:     Head: Normocephalic and atraumatic.     Right Ear: Hearing, tympanic membrane, ear canal and external ear normal.     Left Ear: Hearing, tympanic membrane, ear canal and external ear normal.     Nose: Congestion present.     Mouth/Throat:     Mouth: Mucous membranes are moist.     Pharynx: Oropharynx is clear. Uvula midline. No  pharyngeal swelling, oropharyngeal exudate, posterior oropharyngeal erythema or uvula swelling.     Tonsils: No tonsillar exudate or tonsillar abscesses.     Comments: Ears are clear bilaterally. Nose congested. Oropharynx clear and moist, without uvular swelling or deviation, no trismus or drooling, no tonsillar swelling or erythema, no exudates.   Eyes:     General:        Right eye: No discharge.        Left eye: No discharge.     Conjunctiva/sclera: Conjunctivae normal.  Neck:     Musculoskeletal: Normal range of motion and neck supple.  Cardiovascular:     Rate and Rhythm: Normal rate and regular rhythm.     Heart sounds: Normal heart sounds, S1 normal and S2 normal. No murmur. No friction rub. No gallop.   Pulmonary:     Effort: Pulmonary effort is normal. No respiratory distress.     Breath sounds: Normal breath sounds. No decreased breath sounds, wheezing, rhonchi or rales.     Comments: CTAB in all lung fields, no w/r/r, no hypoxia or increased WOB, speaking in full sentences, SpO2 98% on RA  Abdominal:     General: Bowel sounds are normal. There is no distension.     Palpations: Abdomen is soft. Abdomen is not rigid.     Tenderness: There is no abdominal tenderness. There is no right CVA tenderness, left CVA tenderness, guarding or rebound. Negative signs include Murphy's sign and McBurney's sign.  Musculoskeletal: Normal range of motion.  Skin:    General: Skin is warm and dry.     Findings: No rash.  Neurological:     Mental Status: He is alert and oriented to person, place, and time.     Sensory: Sensation is intact. No sensory deficit.     Motor: Motor function is intact.  Psychiatric:        Mood and Affect: Mood and affect normal.        Behavior: Behavior normal.      ED Treatments / Results  Labs (all labs ordered are listed, but only abnormal results are displayed) Labs Reviewed - No data to display  EKG None  Radiology No results  found.  Procedures Procedures (including critical care time)  Medications Ordered in ED Medications - No data to display   Initial Impression / Assessment and Plan / ED Course  I have reviewed the triage vital signs and the nursing notes.  Pertinent labs & imaging results that were available during my care of the patient were reviewed by me and considered in my medical decision making (see chart for details).     47 y.o. male here with URI symptoms x3 days. Pt is afebrile with a clear lung exam. Mild nasal congestion, throat and ears clear. Likely viral URI. Doubt  need for labs/imaging. Too late for tamiflu.  Pt is agreeable to symptomatic treatment with close follow up with PCP as needed but spoke at length about emergent changing or worsening of symptoms that should prompt return to ER. Pt voices understanding and is agreeable to plan. Stable at time of discharge.    Final Clinical Impressions(s) / ED Diagnoses   Final diagnoses:  Viral upper respiratory tract infection    ED Discharge Orders    [address] [place], [place] 08/04/18 [place], Kevin, MD 08/04/18 1626

## 2018-08-04 NOTE — Discharge Instructions (Signed)
Continue to stay well-hydrated. Gargle warm salt water and spit it out and use chloraseptic spray as needed for sore throat. Continue to alternate between Tylenol and Ibuprofen for pain or fever. Use Mucinex/Robitussin/etc for cough suppression/expectoration of mucus. Use over the counter flonase and the netipot to help with nasal congestion. May consider over-the-counter Benadryl or other antihistamine like Claritin/Zyrtec/etc to decrease secretions and for help with your symptoms. Follow up with your primary care doctor in 5-7 days for recheck of ongoing symptoms. Return to emergency department for emergent changing or worsening of symptoms. °

## 2018-09-02 ENCOUNTER — Other Ambulatory Visit: Payer: Self-pay

## 2018-09-02 ENCOUNTER — Emergency Department (HOSPITAL_COMMUNITY)
Admission: EM | Admit: 2018-09-02 | Discharge: 2018-09-03 | Disposition: A | Discharge status: Home / Self Care | Payer: Self-pay | Attending: Emergency Medicine | Admitting: Emergency Medicine

## 2018-09-02 ENCOUNTER — Encounter (HOSPITAL_COMMUNITY): Payer: Self-pay | Admitting: *Deleted

## 2018-09-02 DIAGNOSIS — I10 Essential (primary) hypertension: Secondary | ICD-10-CM | POA: Insufficient documentation

## 2018-09-02 DIAGNOSIS — Z79899 Other long term (current) drug therapy: Secondary | ICD-10-CM | POA: Insufficient documentation

## 2018-09-02 DIAGNOSIS — M7502 Adhesive capsulitis of left shoulder: Secondary | ICD-10-CM | POA: Insufficient documentation

## 2018-09-02 NOTE — ED Triage Notes (Signed)
Pt reports he was reaching out at something at work on Thursday night and felt a pain in his left bicep area. Taking Ibuprofen without relief.

## 2018-09-03 MED ORDER — DIAZEPAM 2 MG PO TABS: 2.0000 mg | ORAL_TABLET | Freq: Four times a day (QID) | ORAL | 0 refills | Status: DC | PRN

## 2018-09-03 MED ORDER — OXYCODONE-ACETAMINOPHEN 5-325 MG PO TABS: 1.0000 | ORAL_TABLET | Freq: Four times a day (QID) | ORAL | 0 refills | Status: DC | PRN

## 2018-09-03 MED ORDER — METHOCARBAMOL 500 MG PO TABS
500.0000 mg | ORAL_TABLET | Freq: Once | ORAL | Status: AC
  Administered 2018-09-03: 500 mg via ORAL
  Filled 2018-09-03: qty 1

## 2018-09-03 MED ORDER — OXYCODONE-ACETAMINOPHEN 5-325 MG PO TABS
2.0000 | ORAL_TABLET | Freq: Once | ORAL | Status: AC
  Administered 2018-09-03: 2 via ORAL
  Filled 2018-09-03: qty 2

## 2018-09-03 MED ORDER — NAPROXEN 500 MG PO TABS: 500.0000 mg | ORAL_TABLET | Freq: Two times a day (BID) | ORAL | 0 refills | Status: DC

## 2018-09-03 MED ORDER — KETOROLAC TROMETHAMINE 60 MG/2ML IM SOLN
60.0000 mg | Freq: Once | INTRAMUSCULAR | Status: AC
  Administered 2018-09-03: 60 mg via INTRAMUSCULAR
  Filled 2018-09-03: qty 2

## 2018-09-03 NOTE — ED Notes (Signed)
Swollen painful lt arm after over extending it 2-3 days ago his lt hand is swoeeen also

## 2018-09-03 NOTE — Discharge Instructions (Addendum)
We recommend frequent range of motion exercises.  Take naproxen as prescribed for inflammation and Valium for muscle spasms.  You may use Percocet for management of severe pain.  Alternate ice and heat to your shoulder 3-4 times per day.  Follow-up with sports medicine.

## 2018-09-21 NOTE — ED Provider Notes (Signed)
[facility] EMERGENCY DEPARTMENT Provider Note   CSN: [id number] Arrival date & time: 09/02/18  2346    History   Chief Complaint Chief Complaint  Patient presents with  . Arm Pain    HPI Eddie Wu is a 48 y.o. male.   48 year old male presents to the emergency department for evaluation of left shoulder pain.  States that he was extending his left arm out at work on Thursday night to reach something, when he felt a pull in his shoulder.  He has been experiencing constant pain since this time which radiates down towards his elbow.  Pain has been gradually worsening, now with increased pain with range of motion.  He has had no improvement taking ibuprofen at home.  No numbness or tingling to the affected extremity.  Denies any fall or direct trauma to the area.  The history is provided by the patient. No language interpreter was used.  Arm Pain     Past Medical History:  Diagnosis Date  . Allergy   . Cataract   . Diverticulitis   . Hypertension   . Obesity     There are no active problems to display for this patient.   History reviewed. No pertinent surgical history.      Home Medications    Prior to Admission medications   Medication Sig Start Date End Date Taking? Authorizing Provider  amLODipine (NORVASC) 5 MG tablet Take 5 mg by mouth daily. 11/08/17   [provider]  diazepam (VALIUM) 2 MG tablet Take 1 tablet (2 mg total) by mouth every 6 (six) hours as needed for muscle spasms. 09/03/18   Antonietta Breach, PA-C  hydrochlorothiazide (HYDRODIURIL) 25 MG tablet Take 25 mg by mouth daily. 11/08/17   [provider]  naproxen (NAPROSYN) 500 MG tablet Take 1 tablet (500 mg total) by mouth 2 (two) times daily. 09/03/18   Antonietta Breach, PA-C  oxyCODONE-acetaminophen (PERCOCET/ROXICET) 5-325 MG tablet Take 1-2 tablets by mouth every 6 (six) hours as needed for severe pain. 09/03/18   Antonietta Breach, PA-C    Family History Family  History  Problem Relation Age of Onset  . Colon cancer Neg Hx   . Esophageal cancer Neg Hx   . Liver cancer Neg Hx   . Pancreatic cancer Neg Hx   . Prostate cancer Neg Hx   . Rectal cancer Neg Hx   . Stomach cancer Neg Hx     Social History Social History   Tobacco Use  . Smoking status: Never Smoker  . Smokeless tobacco: Never Used  . Tobacco comment: Dip - age 53, quit as a teenager  Substance Use Topics  . Alcohol use: Yes    Alcohol/week: 7.0 standard drinks    Types: 3 Cans of beer, 4 Shots of liquor per week  . Drug use: Yes    Types: Marijuana, Codeine    Comment: smoked marijuana 12-26-17, cocaine 08-2017     Allergies   Patient has no known allergies.   Review of Systems Review of Systems Ten systems reviewed and are negative for acute change, except as noted in the HPI.    Physical Exam Updated Vital Signs BP (!) 173/87   Pulse 72   Temp 98.7 F (37.1 C) (Oral)   Resp 18   Ht 5\' 6"  (1.676 m)   Wt 95.3 kg   SpO2 99%   BMI 33.89 kg/m   Physical Exam Vitals signs and nursing note reviewed.  Constitutional:  General: He is not in acute distress.    Appearance: He is well-developed. He is not diaphoretic.     Comments: Nontoxic appearing and in NAD  HENT:     Head: Normocephalic and atraumatic.  Eyes:     General: No scleral icterus.    Conjunctiva/sclera: Conjunctivae normal.  Neck:     Musculoskeletal: Normal range of motion.  Cardiovascular:     Rate and Rhythm: Normal rate and regular rhythm.     Pulses: Normal pulses.     Comments: Distal radial pulse 2+ in the LUE Pulmonary:     Effort: Pulmonary effort is normal. No respiratory distress.  Musculoskeletal: Normal range of motion.     Comments: TTP to the anterior L shoulder with decreased ROM 2/2 pain. No bony deformity or crepitus.  Skin:    General: Skin is warm and dry.     Coloration: Skin is not pale.     Findings: No erythema or rash.  Neurological:     Mental Status: He  is alert and oriented to person, place, and time.     Comments: Sensation to light touch intact. Grip strength 5/5 bilaterally.  Psychiatric:        Behavior: Behavior normal.      ED Treatments / Results  Labs (all labs ordered are listed, but only abnormal results are displayed) Labs Reviewed - No data to display  EKG None  Radiology No results found.  Procedures Procedures (including critical care time)  Medications Ordered in ED Medications  oxyCODONE-acetaminophen (PERCOCET/ROXICET) 5-325 MG per tablet 2 tablet (2 tablets Oral Given 09/03/18 0419)  methocarbamol (ROBAXIN) tablet 500 mg (500 mg Oral Given 09/03/18 0419)  ketorolac (TORADOL) injection 60 mg (60 mg Intramuscular Given 09/03/18 0418)     Initial Impression / Assessment and Plan / ED Course  I have reviewed the triage vital signs and the nursing notes.  Pertinent labs & imaging results that were available during my care of the patient were reviewed by me and considered in my medical decision making (see chart for details).     Patient presents to the emergency department for evaluation of L shoulder pain. Patient neurovascularly intact on exam. No swelling, erythema, heat to touch to the affected area; no concern for septic joint. Compartments in the affected extremity are soft. He does have TTP to the anterior shoulder joint, decreased ROM 2/2 pain; symptoms c/w adhesive capsulitis from rotator cuff injury. Plan for supportive management including RICE and NSAIDs.  Will also prescribe oxycodone for additional pain control and Valium for muscle relaxers.  Referral given to sports medicine.  Low risk for overdose/misuse of medications upon review of [place] substance database. Return precautions discussed and provided. Patient discharged in stable condition with no unaddressed concerns.   Final Clinical Impressions(s) / ED Diagnoses   Final diagnoses:  Adhesive capsulitis of left shoulder    ED  Discharge Orders         Ordered    oxyCODONE-acetaminophen (PERCOCET/ROXICET) 5-325 MG tablet  Every 6 hours PRN,   Status:  Discontinued     09/03/18 0405    diazepam (VALIUM) 2 MG tablet  Every 6 hours PRN,   Status:  Discontinued     09/03/18 0405    naproxen (NAPROSYN) 500 MG tablet  2 times daily,   Status:  Discontinued     09/03/18 0405    diazepam (VALIUM) 2 MG tablet  Every 6 hours PRN     09/03/18 0408  naproxen (NAPROSYN) 500 MG tablet  2 times daily     09/03/18 0408    oxyCODONE-acetaminophen (PERCOCET/ROXICET) 5-325 MG tablet  Every 6 hours PRN     09/03/18 0408           Antonietta Breach, PA-C 09/21/18 0335    Ezequiel Essex, MD 09/21/18 [phone]

## 2019-05-05 ENCOUNTER — Encounter (HOSPITAL_BASED_OUTPATIENT_CLINIC_OR_DEPARTMENT_OTHER): Payer: Self-pay

## 2019-05-05 ENCOUNTER — Emergency Department (HOSPITAL_BASED_OUTPATIENT_CLINIC_OR_DEPARTMENT_OTHER): Payer: Self-pay

## 2019-05-05 ENCOUNTER — Emergency Department (HOSPITAL_BASED_OUTPATIENT_CLINIC_OR_DEPARTMENT_OTHER)
Admission: EM | Admit: 2019-05-05 | Discharge: 2019-05-06 | Disposition: A | Discharge status: Home / Self Care | Payer: Self-pay | Attending: Emergency Medicine | Admitting: Emergency Medicine

## 2019-05-05 ENCOUNTER — Other Ambulatory Visit: Payer: Self-pay

## 2019-05-05 DIAGNOSIS — S6701XA Crushing injury of right thumb, initial encounter: Secondary | ICD-10-CM | POA: Insufficient documentation

## 2019-05-05 DIAGNOSIS — S6710XA Crushing injury of unspecified finger(s), initial encounter: Secondary | ICD-10-CM

## 2019-05-05 DIAGNOSIS — W231XXA Caught, crushed, jammed, or pinched between stationary objects, initial encounter: Secondary | ICD-10-CM | POA: Insufficient documentation

## 2019-05-05 DIAGNOSIS — Y999 Unspecified external cause status: Secondary | ICD-10-CM | POA: Insufficient documentation

## 2019-05-05 DIAGNOSIS — Y929 Unspecified place or not applicable: Secondary | ICD-10-CM | POA: Insufficient documentation

## 2019-05-05 DIAGNOSIS — F1410 Cocaine abuse, uncomplicated: Secondary | ICD-10-CM | POA: Insufficient documentation

## 2019-05-05 DIAGNOSIS — F1210 Cannabis abuse, uncomplicated: Secondary | ICD-10-CM | POA: Insufficient documentation

## 2019-05-05 DIAGNOSIS — S61111A Laceration without foreign body of right thumb with damage to nail, initial encounter: Secondary | ICD-10-CM | POA: Insufficient documentation

## 2019-05-05 DIAGNOSIS — I10 Essential (primary) hypertension: Secondary | ICD-10-CM | POA: Insufficient documentation

## 2019-05-05 DIAGNOSIS — Y939 Activity, unspecified: Secondary | ICD-10-CM | POA: Insufficient documentation

## 2019-05-05 MED ORDER — LIDOCAINE HCL (PF) 1 % IJ SOLN
10.0000 mL | Freq: Once | INTRAMUSCULAR | Status: AC
  Administered 2019-05-05: 10 mL
  Filled 2019-05-05: qty 10
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
## 2019-05-05 NOTE — ED Notes (Signed)
ED Provider at bedside. 

## 2019-05-05 NOTE — ED Notes (Signed)
X-ray at bedside

## 2019-05-06 MED ORDER — OXYCODONE-ACETAMINOPHEN 5-325 MG PO TABS
2.0000 | ORAL_TABLET | Freq: Once | ORAL | Status: AC
  Administered 2019-05-06: 02:00:00 2 via ORAL
  Filled 2019-05-06: qty 2

## 2019-05-06 MED ORDER — OXYCODONE-ACETAMINOPHEN 5-325 MG PO TABS: 2.0000 | ORAL_TABLET | ORAL | 0 refills | Status: DC | PRN

## 2019-05-06 NOTE — Discharge Instructions (Addendum)
I did not perform a final repair on your thumb secondary to the significant amount of swelling, inability to get it numb and extensive injury related to the the crushing of it.  It is imperative that you call the above number and get a follow-up appointment with a hand surgeon for definitive repair.
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
## 2019-05-06 NOTE — ED Notes (Signed)
ED Provider at bedside. 

## 2019-05-09 DIAGNOSIS — S61111A Laceration without foreign body of right thumb with damage to nail, initial encounter: Secondary | ICD-10-CM | POA: Insufficient documentation

## 2019-05-09 DIAGNOSIS — S6701XA Crushing injury of right thumb, initial encounter: Secondary | ICD-10-CM | POA: Insufficient documentation

## 2019-06-04 ENCOUNTER — Other Ambulatory Visit: Payer: Self-pay | Admitting: Orthopedic Surgery

## 2019-06-04 DIAGNOSIS — R2230 Localized swelling, mass and lump, unspecified upper limb: Secondary | ICD-10-CM

## 2019-07-30 ENCOUNTER — Other Ambulatory Visit: Payer: Self-pay | Admitting: Cardiology

## 2019-07-30 DIAGNOSIS — Z20822 Contact with and (suspected) exposure to covid-19: Secondary | ICD-10-CM

## 2019-07-31 ENCOUNTER — Telehealth: Payer: Self-pay | Admitting: Critical Care Medicine

## 2019-07-31 LAB — NOVEL CORONAVIRUS, NAA: SARS-CoV-2, NAA: DETECTED — AB
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
## 2019-08-01 ENCOUNTER — Other Ambulatory Visit: Payer: Self-pay

## 2019-08-01 ENCOUNTER — Telehealth: Payer: Self-pay | Admitting: Infectious Diseases

## 2019-08-01 NOTE — Telephone Encounter (Signed)
-----   Message from [place] Callas, NP sent at 07/31/2019  1:11 PM EST ----- Regarding: COVID + ?infusion Call in AM to see decision re: scheduling infusion

## 2019-08-01 NOTE — Telephone Encounter (Signed)
Called to discuss with patient about Covid symptoms and the use of bamlanivimab, a monoclonal antibody infusion for those with mild to moderate Covid symptoms and at a high risk of hospitalization.  Pt is qualified for this infusion at the [facility] infusion center due to BMI>35 however his symptoms actually started on 12/14 which would put him out of the window for potential benefit.   Discussed symptomatic care over the phone with antidiarrheal and tylenol to help with diarrhea and chills. Advised to stay well hydrated also.

## 2019-09-26 ENCOUNTER — Emergency Department (HOSPITAL_BASED_OUTPATIENT_CLINIC_OR_DEPARTMENT_OTHER): Payer: Self-pay

## 2019-09-26 ENCOUNTER — Encounter (HOSPITAL_BASED_OUTPATIENT_CLINIC_OR_DEPARTMENT_OTHER): Payer: Self-pay

## 2019-09-26 ENCOUNTER — Other Ambulatory Visit: Payer: Self-pay

## 2019-09-26 ENCOUNTER — Emergency Department (HOSPITAL_BASED_OUTPATIENT_CLINIC_OR_DEPARTMENT_OTHER)
Admission: EM | Admit: 2019-09-26 | Discharge: 2019-09-26 | Disposition: A | Discharge status: Home / Self Care | Payer: Self-pay | Attending: Emergency Medicine | Admitting: Emergency Medicine

## 2019-09-26 DIAGNOSIS — M549 Dorsalgia, unspecified: Secondary | ICD-10-CM

## 2019-09-26 DIAGNOSIS — R1031 Right lower quadrant pain: Secondary | ICD-10-CM | POA: Insufficient documentation

## 2019-09-26 DIAGNOSIS — I10 Essential (primary) hypertension: Secondary | ICD-10-CM | POA: Insufficient documentation

## 2019-09-26 DIAGNOSIS — M545 Low back pain: Secondary | ICD-10-CM | POA: Insufficient documentation

## 2019-09-26 DIAGNOSIS — R195 Other fecal abnormalities: Secondary | ICD-10-CM | POA: Insufficient documentation

## 2019-09-26 DIAGNOSIS — R109 Unspecified abdominal pain: Secondary | ICD-10-CM

## 2019-09-26 DIAGNOSIS — Z79899 Other long term (current) drug therapy: Secondary | ICD-10-CM | POA: Insufficient documentation

## 2019-09-26 LAB — CBC WITH DIFFERENTIAL/PLATELET
Abs Immature Granulocytes: 0.02 10*3/uL (ref 0.00–0.07)
Basophils Absolute: 0.1 10*3/uL (ref 0.0–0.1)
Basophils Relative: 1 %
Eosinophils Absolute: 0.2 10*3/uL (ref 0.0–0.5)
Eosinophils Relative: 2 %
HCT: 40.6 % (ref 39.0–52.0)
Hemoglobin: 13.5 g/dL (ref 13.0–17.0)
Immature Granulocytes: 0 %
Lymphocytes Relative: 17 %
Lymphs Abs: 1.5 10*3/uL (ref 0.7–4.0)
MCH: 29 pg (ref 26.0–34.0)
MCHC: 33.3 g/dL (ref 30.0–36.0)
MCV: 87.3 fL (ref 80.0–100.0)
Monocytes Absolute: 0.9 10*3/uL (ref 0.1–1.0)
Monocytes Relative: 11 %
Neutro Abs: 6.1 10*3/uL (ref 1.7–7.7)
Neutrophils Relative %: 69 %
Platelets: 346 10*3/uL (ref 150–400)
RBC: 4.65 MIL/uL (ref 4.22–5.81)
RDW: 14.6 % (ref 11.5–15.5)
WBC: 8.9 10*3/uL (ref 4.0–10.5)
nRBC: 0 % (ref 0.0–0.2)

## 2019-09-26 LAB — COMPREHENSIVE METABOLIC PANEL
ALT: 28 U/L (ref 0–44)
AST: 27 U/L (ref 15–41)
Albumin: 3.8 g/dL (ref 3.5–5.0)
Alkaline Phosphatase: 55 U/L (ref 38–126)
Anion gap: 11 (ref 5–15)
BUN: 15 mg/dL (ref 6–20)
CO2: 26 mmol/L (ref 22–32)
Calcium: 9.4 mg/dL (ref 8.9–10.3)
Chloride: 98 mmol/L (ref 98–111)
Creatinine, Ser: 0.76 mg/dL (ref 0.61–1.24)
GFR calc Af Amer: >60 mL/min (ref >60)
GFR calc non Af Amer: >60 mL/min (ref >60)
Glucose, Bld: 92 mg/dL (ref 70–99)
Potassium: 3.5 mmol/L (ref 3.5–5.1)
Sodium: 135 mmol/L (ref 135–145)
Total Bilirubin: 1 mg/dL (ref 0.3–1.2)
Total Protein: 7.5 g/dL (ref 6.5–8.1)

## 2019-09-26 LAB — LIPASE, BLOOD: Lipase: 23 U/L (ref 11–51)

## 2019-09-26 LAB — OCCULT BLOOD X 1 CARD TO LAB, STOOL: Fecal Occult Bld: NEGATIVE

## 2019-09-26 MED ORDER — CYCLOBENZAPRINE HCL 10 MG PO TABS: 10.0000 mg | ORAL_TABLET | Freq: Two times a day (BID) | ORAL | 0 refills | Status: AC | PRN

## 2019-09-26 MED ORDER — IOHEXOL 300 MG/ML  SOLN
100.0000 mL | Freq: Once | INTRAMUSCULAR | Status: AC | PRN
  Administered 2019-09-26: 100 mL via INTRAVENOUS

## 2019-09-26 MED ORDER — NAPROXEN 500 MG PO TABS: 500.0000 mg | ORAL_TABLET | Freq: Two times a day (BID) | ORAL | 0 refills | Status: DC

## 2019-09-26 MED ORDER — KETOROLAC TROMETHAMINE 15 MG/ML IJ SOLN
15.0000 mg | Freq: Once | INTRAMUSCULAR | Status: AC
  Administered 2019-09-26: 15 mg via INTRAVENOUS
  Filled 2019-09-26: qty 1

## 2019-09-26 NOTE — ED Provider Notes (Signed)
[facility] EMERGENCY DEPARTMENT Provider Note   CSN: [id number] Arrival date & time: 09/26/19  1130     History Chief Complaint  Patient presents with  . Flank Pain    Eddie Wu is a 49 y.o. male.  Patient is a 49 year old gentleman with past medical history of diverticulitis and hypertension presenting to the emergency department for right side pain and bloody stools.  Patient reports that he has had had pain in his right lower side for the past couple of days.  He initially thought that this may have been caused when he was carrying a box on that side and bumped it on a door.  However, this morning he woke up and had bloody stools x3 so he became concerned this might be diverticulitis and came to the emergency department.  He reports a history of diverticulitis as well as internal hemorrhoids.  Denies any pain with bowel movements.  Denies any dysuria, fever, chills, nausea, vomiting.  Back pain does not radiate into his legs and does not cause any saddle anesthesia, loss of control of bowel or bladder movements, urinary retention.  He has tried over-the-counter medications without relief.  He describes the pain as sharp and worse with movement.        Past Medical History:  Diagnosis Date  . Allergy   . Cataract   . Diverticulitis   . Hypertension   . Obesity     There are no problems to display for this patient.   History reviewed. No pertinent surgical history.     Family History  Problem Relation Age of Onset  . Colon cancer Neg Hx   . Esophageal cancer Neg Hx   . Liver cancer Neg Hx   . Pancreatic cancer Neg Hx   . Prostate cancer Neg Hx   . Rectal cancer Neg Hx   . Stomach cancer Neg Hx     Social History   Tobacco Use  . Smoking status: Never Smoker  . Smokeless tobacco: Never Used  Substance Use Topics  . Alcohol use: Yes    Comment: weekly  . Drug use: Yes    Types: Marijuana, Codeine    Home Medications Prior to Admission  medications   Medication Sig Start Date End Date Taking? Authorizing Provider  amLODipine (NORVASC) 5 MG tablet TAKE 1 TABLET BY MOUTH EVERY DAY 02/27/19  Yes [provider]  hydrochlorothiazide (HYDRODIURIL) 25 MG tablet Take 25 mg by mouth daily. 11/08/17  Yes [provider]  naproxen (NAPROSYN) 500 MG tablet Take 1 tablet (500 mg total) by mouth 2 (two) times daily. 09/03/18   Antonietta Breach, PA-C  oxyCODONE-acetaminophen (PERCOCET) 5-325 MG tablet Take 2 tablets by mouth every 4 (four) hours as needed for severe pain. 05/06/19   Mesner, Corene Cornea, MD    Allergies    Patient has no known allergies.  Review of Systems   Review of Systems  Constitutional: Negative for appetite change and fever.  HENT: Negative for congestion and sore throat.   Respiratory: Negative for cough and shortness of breath.   Cardiovascular: Negative for chest pain.  Gastrointestinal: Positive for blood in stool. Negative for abdominal pain, constipation, diarrhea, nausea, rectal pain and vomiting.  Genitourinary: Negative for dysuria.  Musculoskeletal: Positive for back pain.  Skin: Negative for rash and wound.  Neurological: Negative for dizziness and light-headedness.    Physical Exam Updated Vital Signs BP 119/78 (BP Location: Right Arm)   Pulse 78   Temp 98.3 F (  36.8 C) (Oral)   Resp 16   Ht 5\' 8"  (1.727 m)   Wt 101.2 kg   SpO2 98%   BMI 33.92 kg/m   Physical Exam Vitals and nursing note reviewed. Exam conducted with a chaperone present.  Constitutional:      General: He is not in acute distress.    Appearance: Normal appearance. He is not ill-appearing, toxic-appearing or diaphoretic.  HENT:     Mouth/Throat:     Mouth: Mucous membranes are moist.  Eyes:     Conjunctiva/sclera: Conjunctivae normal.  Cardiovascular:     Rate and Rhythm: Normal rate and regular rhythm.  Pulmonary:     Effort: Pulmonary effort is normal.     Breath sounds: Normal breath sounds.  Abdominal:      General: Abdomen is flat. Bowel sounds are normal. There is no distension.     Tenderness: There is no guarding.  Genitourinary:    Rectum: No tenderness, anal fissure or external hemorrhoid. Normal anal tone.  Musculoskeletal:       Back:  Neurological:     Mental Status: He is alert.     ED Results / Procedures / Treatments   Labs (all labs ordered are listed, but only abnormal results are displayed) Labs Reviewed  COMPREHENSIVE METABOLIC PANEL  LIPASE, BLOOD  CBC WITH DIFFERENTIAL/PLATELET  OCCULT BLOOD X 1 CARD TO LAB, STOOL    EKG None  Radiology No results found.  Procedures Procedures (including critical care time)  Medications Ordered in ED Medications  ketorolac (TORADOL) 15 MG/ML injection 15 mg (has no administration in time range)    ED Course  I have reviewed the triage vital signs and the nursing notes.  Pertinent labs & imaging results that were available during my care of the patient were reviewed by me and considered in my medical decision making (see chart for details).  Clinical Course as of Sep 26 1046  Wed Sep 26, 2019  1526 Patient presenting with back pain on the right side after hitting it with a box. Also had bloody stool this AM and was concerned he may be having diverticulitis flare up. Workup here is completely negative including labs and CT scan. Patient likely having musculoskeletal pain in the back. He also has hx of internal hemorrhoids which may be the cause of his rectal bleeding. Patient will be treated for musculoskeletal pain and advised on return precaution and to follow up with GI for further workup/treatment if rectal bleeding continues.    [KM]    Clinical Course User Index [KM] Kristine Royal   MDM Rules/Calculators/A&P                      Based on review of vitals, medical screening exam, lab work and/or imaging, there does not appear to be an acute, emergent etiology for the patient's symptoms. Counseled pt on  good return precautions and encouraged both PCP and ED follow-up as needed.  Prior to discharge, I also discussed incidental imaging findings with patient in detail and advised appropriate, recommended follow-up in detail.  Clinical Impression: 1. Flank pain   2. Musculoskeletal back pain     Disposition: Discharge  Prior to providing a prescription for a controlled substance, I independently reviewed the patient's recent prescription history on the [facility]. The patient had no recent or regular prescriptions and was deemed appropriate for a brief, less than 3 day prescription of narcotic for acute  analgesia.  This note was prepared with assistance of Systems analyst. Occasional wrong-word or sound-a-like substitutions may have occurred due to the inherent limitations of voice recognition software.  Final Clinical Impression(s) / ED Diagnoses Final diagnoses:  None    Rx / DC Orders ED Discharge Orders    None       Kristine Royal 09/27/19 1048    Virgel Manifold, MD 09/27/19 1123

## 2019-09-26 NOTE — ED Triage Notes (Signed)
Pt c/o right flank pain x 3 days-pain started after he was carrying a box/ran into a wall and box struck area-pt unsure if event is r/t pain-states pain also feels same as when he diverticulitis and he had 3 bloody stools x today-NAD-steady gait

## 2019-09-26 NOTE — ED Notes (Signed)
Pt transported to CT ?

## 2019-09-26 NOTE — ED Notes (Signed)
ED Provider at bedside. 

## 2019-09-26 NOTE — Discharge Instructions (Signed)
Thank you for allowing me to care for you today. Please return to the emergency department if you have new or worsening symptoms. Take your medications as instructed.  ° °

## 2021-08-25 DIAGNOSIS — G4733 Obstructive sleep apnea (adult) (pediatric): Secondary | ICD-10-CM | POA: Diagnosis not present

## 2021-09-25 DIAGNOSIS — G4733 Obstructive sleep apnea (adult) (pediatric): Secondary | ICD-10-CM | POA: Diagnosis not present

## 2021-09-26 DIAGNOSIS — U071 COVID-19: Secondary | ICD-10-CM | POA: Diagnosis not present

## 2021-09-26 DIAGNOSIS — Z20822 Contact with and (suspected) exposure to covid-19: Secondary | ICD-10-CM | POA: Diagnosis not present

## 2021-09-29 DIAGNOSIS — R400 Somnolence: Secondary | ICD-10-CM | POA: Insufficient documentation

## 2021-09-29 DIAGNOSIS — E669 Obesity, unspecified: Secondary | ICD-10-CM | POA: Insufficient documentation

## 2021-09-29 DIAGNOSIS — Z20822 Contact with and (suspected) exposure to covid-19: Secondary | ICD-10-CM | POA: Diagnosis not present

## 2021-09-29 DIAGNOSIS — U071 COVID-19: Secondary | ICD-10-CM | POA: Diagnosis not present

## 2021-09-29 DIAGNOSIS — G4733 Obstructive sleep apnea (adult) (pediatric): Secondary | ICD-10-CM | POA: Insufficient documentation

## 2021-09-29 DIAGNOSIS — I10 Essential (primary) hypertension: Secondary | ICD-10-CM | POA: Insufficient documentation

## 2021-10-06 DIAGNOSIS — Z20822 Contact with and (suspected) exposure to covid-19: Secondary | ICD-10-CM | POA: Diagnosis not present

## 2021-10-06 DIAGNOSIS — Z03818 Encounter for observation for suspected exposure to other biological agents ruled out: Secondary | ICD-10-CM | POA: Diagnosis not present

## 2021-10-23 DIAGNOSIS — G4733 Obstructive sleep apnea (adult) (pediatric): Secondary | ICD-10-CM | POA: Diagnosis not present

## 2021-11-09 DIAGNOSIS — G4733 Obstructive sleep apnea (adult) (pediatric): Secondary | ICD-10-CM | POA: Diagnosis not present

## 2021-11-09 DIAGNOSIS — I10 Essential (primary) hypertension: Secondary | ICD-10-CM | POA: Diagnosis not present

## 2021-11-09 DIAGNOSIS — E7800 Pure hypercholesterolemia, unspecified: Secondary | ICD-10-CM | POA: Diagnosis not present

## 2021-11-23 DIAGNOSIS — G4733 Obstructive sleep apnea (adult) (pediatric): Secondary | ICD-10-CM | POA: Diagnosis not present

## 2021-11-24 ENCOUNTER — Other Ambulatory Visit: Payer: Self-pay | Admitting: Family Medicine

## 2021-11-24 DIAGNOSIS — N644 Mastodynia: Secondary | ICD-10-CM | POA: Diagnosis not present

## 2021-12-08 ENCOUNTER — Ambulatory Visit: Payer: Self-pay

## 2021-12-08 ENCOUNTER — Ambulatory Visit
Admission: RE | Admit: 2021-12-08 | Discharge: 2021-12-08 | Disposition: A | Discharge status: Home / Self Care | Payer: BC Managed Care – PPO | Source: Ambulatory Visit | Attending: Family Medicine | Admitting: Family Medicine

## 2021-12-08 DIAGNOSIS — N644 Mastodynia: Secondary | ICD-10-CM

## 2021-12-08 DIAGNOSIS — N62 Hypertrophy of breast: Secondary | ICD-10-CM | POA: Diagnosis not present

## 2021-12-11 DIAGNOSIS — N62 Hypertrophy of breast: Secondary | ICD-10-CM | POA: Diagnosis not present

## 2021-12-11 DIAGNOSIS — J309 Allergic rhinitis, unspecified: Secondary | ICD-10-CM | POA: Diagnosis not present

## 2021-12-23 DIAGNOSIS — G4733 Obstructive sleep apnea (adult) (pediatric): Secondary | ICD-10-CM | POA: Diagnosis not present

## 2022-01-23 DIAGNOSIS — G4733 Obstructive sleep apnea (adult) (pediatric): Secondary | ICD-10-CM | POA: Diagnosis not present

## 2022-02-22 DIAGNOSIS — G4733 Obstructive sleep apnea (adult) (pediatric): Secondary | ICD-10-CM | POA: Diagnosis not present

## 2022-04-15 DIAGNOSIS — G4733 Obstructive sleep apnea (adult) (pediatric): Secondary | ICD-10-CM | POA: Diagnosis not present

## 2022-05-15 DIAGNOSIS — G4733 Obstructive sleep apnea (adult) (pediatric): Secondary | ICD-10-CM | POA: Diagnosis not present

## 2022-06-15 DIAGNOSIS — G4733 Obstructive sleep apnea (adult) (pediatric): Secondary | ICD-10-CM | POA: Diagnosis not present

## 2022-07-16 DIAGNOSIS — G4733 Obstructive sleep apnea (adult) (pediatric): Secondary | ICD-10-CM | POA: Diagnosis not present

## 2022-07-21 ENCOUNTER — Other Ambulatory Visit: Payer: Self-pay

## 2022-07-21 ENCOUNTER — Emergency Department (HOSPITAL_BASED_OUTPATIENT_CLINIC_OR_DEPARTMENT_OTHER): Payer: BC Managed Care – PPO

## 2022-07-21 ENCOUNTER — Encounter (HOSPITAL_BASED_OUTPATIENT_CLINIC_OR_DEPARTMENT_OTHER): Payer: Self-pay

## 2022-07-21 ENCOUNTER — Emergency Department (HOSPITAL_BASED_OUTPATIENT_CLINIC_OR_DEPARTMENT_OTHER)
Admission: EM | Admit: 2022-07-21 | Discharge: 2022-07-21 | Disposition: A | Discharge status: Home / Self Care | Payer: BC Managed Care – PPO | Attending: Emergency Medicine | Admitting: Emergency Medicine

## 2022-07-21 DIAGNOSIS — Z1152 Encounter for screening for COVID-19: Secondary | ICD-10-CM | POA: Insufficient documentation

## 2022-07-21 DIAGNOSIS — I10 Essential (primary) hypertension: Secondary | ICD-10-CM | POA: Diagnosis not present

## 2022-07-21 DIAGNOSIS — R0789 Other chest pain: Secondary | ICD-10-CM | POA: Diagnosis not present

## 2022-07-21 DIAGNOSIS — J101 Influenza due to other identified influenza virus with other respiratory manifestations: Secondary | ICD-10-CM | POA: Diagnosis not present

## 2022-07-21 DIAGNOSIS — J9811 Atelectasis: Secondary | ICD-10-CM | POA: Diagnosis not present

## 2022-07-21 DIAGNOSIS — E876 Hypokalemia: Secondary | ICD-10-CM | POA: Insufficient documentation

## 2022-07-21 DIAGNOSIS — R1011 Right upper quadrant pain: Secondary | ICD-10-CM | POA: Diagnosis not present

## 2022-07-21 DIAGNOSIS — R079 Chest pain, unspecified: Secondary | ICD-10-CM | POA: Diagnosis not present

## 2022-07-21 DIAGNOSIS — Z79899 Other long term (current) drug therapy: Secondary | ICD-10-CM | POA: Insufficient documentation

## 2022-07-21 LAB — CBC WITH DIFFERENTIAL/PLATELET
Abs Immature Granulocytes: 0.02 10*3/uL (ref 0.00–0.07)
Basophils Absolute: 0 10*3/uL (ref 0.0–0.1)
Basophils Relative: 1 %
Eosinophils Absolute: 0.3 10*3/uL (ref 0.0–0.5)
Eosinophils Relative: 8 %
HCT: 39.1 % (ref 39.0–52.0)
Hemoglobin: 13.1 g/dL (ref 13.0–17.0)
Immature Granulocytes: 1 %
Lymphocytes Relative: 21 %
Lymphs Abs: 0.8 10*3/uL (ref 0.7–4.0)
MCH: 29.2 pg (ref 26.0–34.0)
MCHC: 33.5 g/dL (ref 30.0–36.0)
MCV: 87.1 fL (ref 80.0–100.0)
Monocytes Absolute: 0.8 10*3/uL (ref 0.1–1.0)
Monocytes Relative: 19 %
Neutro Abs: 2.1 10*3/uL (ref 1.7–7.7)
Neutrophils Relative %: 50 %
Platelets: 288 10*3/uL (ref 150–400)
RBC: 4.49 MIL/uL (ref 4.22–5.81)
RDW: 13.9 % (ref 11.5–15.5)
WBC: 4 10*3/uL (ref 4.0–10.5)
nRBC: 0 % (ref 0.0–0.2)

## 2022-07-21 LAB — COMPREHENSIVE METABOLIC PANEL
ALT: 30 U/L (ref 0–44)
AST: 26 U/L (ref 15–41)
Albumin: 3.7 g/dL (ref 3.5–5.0)
Alkaline Phosphatase: 56 U/L (ref 38–126)
Anion gap: 8 (ref 5–15)
BUN: 11 mg/dL (ref 6–20)
CO2: 28 mmol/L (ref 22–32)
Calcium: 8.4 mg/dL — ABNORMAL LOW (ref 8.9–10.3)
Chloride: 102 mmol/L (ref 98–111)
Creatinine, Ser: 0.78 mg/dL (ref 0.61–1.24)
GFR, Estimated: >60 mL/min (ref >60)
Glucose, Bld: 91 mg/dL (ref 70–99)
Potassium: 3.1 mmol/L — ABNORMAL LOW (ref 3.5–5.1)
Sodium: 138 mmol/L (ref 135–145)
Total Bilirubin: 0.7 mg/dL (ref 0.3–1.2)
Total Protein: 7.5 g/dL (ref 6.5–8.1)

## 2022-07-21 LAB — RESP PANEL BY RT-PCR (RSV, FLU A&B, COVID)  RVPGX2
Influenza A by PCR: POSITIVE — AB
Influenza B by PCR: NEGATIVE
Resp Syncytial Virus by PCR: NEGATIVE
SARS Coronavirus 2 by RT PCR: NEGATIVE

## 2022-07-21 LAB — LIPASE, BLOOD: Lipase: 32 U/L (ref 11–51)

## 2022-07-21 LAB — TROPONIN I (HIGH SENSITIVITY): Troponin I (High Sensitivity): 9 ng/L (ref <18)

## 2022-07-21 MED ORDER — POTASSIUM CHLORIDE CRYS ER 20 MEQ PO TBCR
40.0000 meq | EXTENDED_RELEASE_TABLET | Freq: Once | ORAL | Status: AC
  Administered 2022-07-21: 40 meq via ORAL
  Filled 2022-07-21: qty 2

## 2022-07-21 MED ORDER — BENZONATATE 100 MG PO CAPS: 100.0000 mg | ORAL_CAPSULE | Freq: Three times a day (TID) | ORAL | 0 refills | Status: DC

## 2022-07-21 MED ORDER — NAPROXEN 250 MG PO TABS
500.0000 mg | ORAL_TABLET | Freq: Once | ORAL | Status: AC
  Administered 2022-07-21: 500 mg via ORAL
  Filled 2022-07-21: qty 2

## 2022-07-21 NOTE — Discharge Instructions (Addendum)
It was a pleasure taking care of you today.  As discussed, all of your labs were reassuring.  Your potassium was slightly low so you were given potassium here in the ER.  Chest x-ray did not show evidence of pneumonia.  Your abdominal labs were normal. Your flu test was positive. I am sending you home with cough medication.  Follow-up with PCP within 1 week for further evaluation. Return to the ER for new or worsening symptoms.

## 2022-07-21 NOTE — ED Provider Notes (Signed)
[facility] EMERGENCY DEPARTMENT Provider Note   CSN: [id number] Arrival date & time: 07/21/22  1446     History  Chief Complaint  Patient presents with   Chest Pain    Eddie Wu is a 51 y.o. male with a past medical history significant for hypertension and history of diverticulitis who presents to the ED due to right upper abdominal pain/right lower anterior chest wall pain that started last night after coughing and sneezing. He admits to intermittent coughing for the past 2 days.  No history of blood clots, recent surgeries, recent long immobilizations, or hormonal treatments.  No lower extremity edema.  Denies central chest pain.  No shortness of breath.  Denies fever and chills.  No nausea, vomiting, or diarrhea.  Denies rash in location.  No previous abdominal operations.  No injury to area.  History obtained from patient and past medical records. No interpreter used during encounter.       Home Medications Prior to Admission medications   Medication Sig Start Date End Date Taking? Authorizing Provider  benzonatate (TESSALON) 100 MG capsule Take 1 capsule (100 mg total) by mouth every 8 (eight) hours. 07/21/22  Yes Carrianne Hyun C, PA-C  amLODipine (NORVASC) 5 MG tablet TAKE 1 TABLET BY MOUTH EVERY DAY 02/27/19   [provider]  hydrochlorothiazide (HYDRODIURIL) 25 MG tablet Take 25 mg by mouth daily. 11/08/17   [provider]  naproxen (NAPROSYN) 500 MG tablet Take 1 tablet (500 mg total) by mouth 2 (two) times daily. 09/26/19   Alveria Apley, PA-C  oxyCODONE-acetaminophen (PERCOCET) 5-325 MG tablet Take 2 tablets by mouth every 4 (four) hours as needed for severe pain. 05/06/19   Mesner, Corene Cornea, MD      Allergies    Patient has no known allergies.    Review of Systems   Review of Systems  Constitutional:  Negative for chills and fever.  Respiratory:  Positive for cough. Negative for shortness of breath.   Cardiovascular:  Positive  for chest pain. Negative for leg swelling.  Gastrointestinal:  Positive for abdominal pain. Negative for diarrhea, nausea and vomiting.  All other systems reviewed and are negative.   Physical Exam Updated Vital Signs BP 133/74 (BP Location: Left Arm)   Pulse 75   Temp 98 F (36.7 C) (Oral)   Resp 20   Ht '5\' 7"'$  (1.702 m)   Wt 102.1 kg   SpO2 97%   BMI 35.24 kg/m  Physical Exam Vitals and nursing note reviewed.  Constitutional:      General: He is not in acute distress.    Appearance: He is not ill-appearing.  HENT:     Head: Normocephalic.  Eyes:     Pupils: Pupils are equal, round, and reactive to light.  Cardiovascular:     Rate and Rhythm: Normal rate and regular rhythm.     Pulses: Normal pulses.     Heart sounds: Normal heart sounds. No murmur heard.    No friction rub. No gallop.  Pulmonary:     Effort: Pulmonary effort is normal.     Breath sounds: Normal breath sounds.  Chest:       Comments: Mild tenderness throughout right side of ribs without crepitus or deformity. Abdominal:     General: Abdomen is flat. There is no distension.     Palpations: Abdomen is soft.     Tenderness: There is no abdominal tenderness. There is no guarding or rebound.     Comments: Very  mild tenderness in RUQ.  Musculoskeletal:        General: Normal range of motion.     Cervical back: Neck supple.  Skin:    General: Skin is warm and dry.     Comments: No rash  Neurological:     General: No focal deficit present.     Mental Status: He is alert.  Psychiatric:        Mood and Affect: Mood normal.        Behavior: Behavior normal.     ED Results / Procedures / Treatments   Labs (all labs ordered are listed, but only abnormal results are displayed) Labs Reviewed  RESP PANEL BY RT-PCR (RSV, FLU A&B, COVID)  RVPGX2 - Abnormal; Notable for the following components:      Result Value   Influenza A by PCR POSITIVE (*)    All other components within normal limits   COMPREHENSIVE METABOLIC PANEL - Abnormal; Notable for the following components:   Potassium 3.1 (*)    Calcium 8.4 (*)    All other components within normal limits  CBC WITH DIFFERENTIAL/PLATELET  LIPASE, BLOOD  TROPONIN I (HIGH SENSITIVITY)    EKG EKG Interpretation  Date/Time:  Wednesday July 21 2022 14:54:04 EST Ventricular Rate:  69 PR Interval:  182 QRS Duration: 88 QT Interval:  398 QTC Calculation: 426 R Axis:   36 Text Interpretation: Normal sinus rhythm Nonspecific T wave abnormality Abnormal ECG No previous ECGs available Confirmed by Aletta Edouard [phone]) on 07/21/2022 2:56:02 PM  Radiology DG Chest 2 View  Result Date: 07/21/2022 CLINICAL DATA:  Chest pain EXAM: CHEST - 2 VIEW COMPARISON:  Chest x-ray from 2007 FINDINGS: Low lung volumes with vascular crowding and streaky basilar atelectasis but no infiltrates, edema or effusions. No pulmonary lesions. No pneumothorax. The bony thorax is intact. IMPRESSION: Low lung volumes with vascular crowding and streaky basilar atelectasis. No infiltrates, edema or effusions. Electronically Signed   By: Marijo Sanes M.D.   On: 07/21/2022 15:42    Procedures Procedures    Medications Ordered in ED Medications  naproxen (NAPROSYN) tablet 500 mg (500 mg Oral Given 07/21/22 1548)  potassium chloride SA (KLOR-CON M) CR tablet 40 mEq (40 mEq Oral Given 07/21/22 1646)    ED Course/ Medical Decision Making/ A&P Clinical Course as of 07/21/22 1711  Wed Jul 21, 2022  1640 Potassium(!): 3.1 [CA]  1702 Influenza A By PCR(!): POSITIVE [CA]    Clinical Course User Index [CA] Suzy Bouchard, PA-C                           Medical Decision Making Amount and/or Complexity of Data Reviewed External Data Reviewed: notes.    Details: UC note Labs: ordered. Decision-making details documented in ED Course. Radiology: ordered and independent interpretation performed. Decision-making details documented in ED  Course. ECG/medicine tests: ordered and independent interpretation performed. Decision-making details documented in ED Course.  Risk Prescription drug management.   This patient presents to the ED for concern of cough/chest wall pain, this involves an extensive number of treatment options, and is a complaint that carries with it a high risk of complications and morbidity.  The differential diagnosis includes viral infection, PE, pneumonia, acute cholecystitis, ACS, etc  51 year old male presents to the ED due to right upper abdominal/right lower anterior chest wall pain that started last night.  Patient has been having a cough for the past 2 to 3 days.  No fever or chills.  No history of blood clots, recent surgeries or recent long immobilizations, hormonal treatments, or lower extremity edema.  Upon arrival, vitals all within normal limits.  Patient in no acute distress.  Very mild right upper quadrant tenderness without rebound or guarding.   Negative murphy sign. Low suspicion for acute cholecystitis. Mild tenderness throughout right side of ribs without crepitus or deformity.  Chest x-ray ordered to rule out bony fractures or evidence of pneumonia.  Cardiac labs to rule out atypical ACS.  Abdominal labs to rule out evidence of infection or gallbladder/liver etiology.  Lower suspicion for PE/DVT.  Presentation nonconcerning for dissection. Naproxen given for pain.  EKG demonstrates normal sinus rhythm.  No signs of acute ischemia.  CBC unremarkable.  No leukocytosis and normal hemoglobin.  Lipase normal at 32.  Doubt pancreatitis.  CMP significant for hypokalemia at 3.1.  Potassium repleted here in the ED.  Normal renal function.  Troponin normal at 9.  Low suspicion for atypical ACS given normal troponin and nonischemic EKG.  Chest x-ray personally reviewed and interpreted which demonstrates low lung volumes and vascular crowding and streaky basilar atelectasis.  No infiltrates.  Low suspicion for  pneumonia.  No evidence of bony fractures.  Given very minimal tenderness on exam, do not feel CT abdomen is warranted at this time.  Low suspicion for acute cholecystitis, pancreatitis, diverticulitis, or other emergent intra-abdominal etiologies.  Patient has no shortness of breath and normal vitals.  Low suspicion for PE/DVT.  Possible MSK etiology? Suspect cough related to viral infection.   Respiratory panel positive for influenza.  Suspect symptoms related to influenza A.  Patient discharged with cough medication.  Advised to take over-the-counter ibuprofen or Tylenol as needed for pain. Strict ED precautions discussed with patient. Patient states understanding and agrees to plan. Patient discharged home in no acute distress and stable vitals  Has PCP       Final Clinical Impression(s) / ED Diagnoses Final diagnoses:  Chest wall pain  Influenza A    Rx / DC Orders ED Discharge Orders          Ordered    benzonatate (TESSALON) 100 MG capsule  Every 8 hours        07/21/22 1710              Suzy Bouchard, [place] 07/21/22 1711    Tegeler, Gwenyth Allegra, MD 07/21/22 [phone]

## 2022-07-21 NOTE — ED Triage Notes (Signed)
Pt presents with R lower, lateral CP that started after coughing and sneezing. Pt states that certain movements make the pain worse. Denies ShOB, N/V/D.

## 2022-08-16 DIAGNOSIS — G4733 Obstructive sleep apnea (adult) (pediatric): Secondary | ICD-10-CM | POA: Diagnosis not present

## 2022-09-16 DIAGNOSIS — G4733 Obstructive sleep apnea (adult) (pediatric): Secondary | ICD-10-CM | POA: Diagnosis not present

## 2022-09-21 DIAGNOSIS — I10 Essential (primary) hypertension: Secondary | ICD-10-CM | POA: Diagnosis not present

## 2022-09-21 DIAGNOSIS — E7800 Pure hypercholesterolemia, unspecified: Secondary | ICD-10-CM | POA: Diagnosis not present

## 2022-09-21 DIAGNOSIS — Z125 Encounter for screening for malignant neoplasm of prostate: Secondary | ICD-10-CM | POA: Diagnosis not present

## 2022-09-21 DIAGNOSIS — R7303 Prediabetes: Secondary | ICD-10-CM | POA: Diagnosis not present

## 2022-09-21 DIAGNOSIS — Z0000 Encounter for general adult medical examination without abnormal findings: Secondary | ICD-10-CM | POA: Diagnosis not present

## 2022-10-14 DIAGNOSIS — G4733 Obstructive sleep apnea (adult) (pediatric): Secondary | ICD-10-CM | POA: Diagnosis not present

## 2022-10-15 DIAGNOSIS — G4733 Obstructive sleep apnea (adult) (pediatric): Secondary | ICD-10-CM | POA: Diagnosis not present

## 2022-11-14 DIAGNOSIS — G4733 Obstructive sleep apnea (adult) (pediatric): Secondary | ICD-10-CM | POA: Diagnosis not present

## 2022-11-17 ENCOUNTER — Encounter: Payer: Self-pay | Admitting: Gastroenterology

## 2022-12-14 DIAGNOSIS — G4733 Obstructive sleep apnea (adult) (pediatric): Secondary | ICD-10-CM | POA: Diagnosis not present

## 2023-01-06 ENCOUNTER — Ambulatory Visit (AMBULATORY_SURGERY_CENTER): Payer: BC Managed Care – PPO

## 2023-01-06 VITALS — Ht 67.0 in | Wt 215.0 lb

## 2023-01-06 DIAGNOSIS — Z86010 Personal history of colonic polyps: Secondary | ICD-10-CM

## 2023-01-06 MED ORDER — NA SULFATE-K SULFATE-MG SULF 17.5-3.13-1.6 GM/177ML PO SOLN: 1.0000 | Freq: Once | ORAL | 0 refills | Status: AC

## 2023-01-06 NOTE — Progress Notes (Signed)
Pre visit completed via phone call; Patient verified name, DOB, and address;  No egg or soy allergy known to patient;  No issues known to pt with past sedation with any surgeries or procedures; Patient denies ever being told they had issues or difficulty with intubation;  No FH of Malignant Hyperthermia; Pt is not on diet pills; Pt is not on home 02;  Pt is not on blood thinners;  Pt denies issues with constipation- patient reports he takes Metamucil daily (if he can remember to take it )  No A fib or A flutter; Have any cardiac testing pending--NO Pt instructed to use Singlecare[employer]com or GoodRx for a price reduction on prep;  Insurance verified during PV appt=[employer] [place]  Patient's chart reviewed by Cathlyn Parsons CNRA prior to previsit and patient appropriate for the [facility].  Previsit completed and red dot placed by patient's name on their procedure day (on provider's schedule).    Instructions printed and mailed with [facility] Good[facility] coupon to the patient per his request;

## 2023-01-18 ENCOUNTER — Encounter: Payer: BC Managed Care – PPO | Admitting: Gastroenterology

## 2023-02-01 ENCOUNTER — Encounter: Payer: Self-pay | Admitting: Gastroenterology

## 2023-03-03 ENCOUNTER — Encounter: Payer: BC Managed Care – PPO | Admitting: Gastroenterology

## 2023-03-26 ENCOUNTER — Telehealth: Payer: Self-pay | Admitting: Physician Assistant

## 2023-03-26 NOTE — Telephone Encounter (Signed)
Patient called this morning, scheduled for colonoscopy on Monday, 03/28/2023 with Dr. Russella Dar.  He has had a family emergency, father being admitted to the hospital and patient needs to reschedule colonoscopy.  Please contact patient next week to get emergency Eddie Wu for colonoscopy thank you

## 2023-03-28 ENCOUNTER — Encounter: Payer: BC Managed Care – PPO | Admitting: Gastroenterology

## 2023-03-28 NOTE — Telephone Encounter (Signed)
Please see note and reschedule pt colon with Dr Russella Dar Thank you

## 2023-03-28 NOTE — Telephone Encounter (Signed)
Please see note below. 

## 2023-03-31 NOTE — Telephone Encounter (Signed)
Called patient to reschedule he stated he had to put his father in hospice yesterday and will call back when ready.

## 2023-04-07 DIAGNOSIS — R7303 Prediabetes: Secondary | ICD-10-CM | POA: Diagnosis not present

## 2023-04-07 DIAGNOSIS — G4733 Obstructive sleep apnea (adult) (pediatric): Secondary | ICD-10-CM | POA: Diagnosis not present

## 2023-04-07 DIAGNOSIS — I10 Essential (primary) hypertension: Secondary | ICD-10-CM | POA: Diagnosis not present

## 2023-04-07 DIAGNOSIS — Z113 Encounter for screening for infections with a predominantly sexual mode of transmission: Secondary | ICD-10-CM | POA: Diagnosis not present

## 2023-04-07 DIAGNOSIS — E7800 Pure hypercholesterolemia, unspecified: Secondary | ICD-10-CM | POA: Diagnosis not present

## 2023-07-01 DIAGNOSIS — M67432 Ganglion, left wrist: Secondary | ICD-10-CM | POA: Diagnosis not present

## 2023-07-19 DIAGNOSIS — M25532 Pain in left wrist: Secondary | ICD-10-CM | POA: Diagnosis not present

## 2023-08-04 DIAGNOSIS — M25532 Pain in left wrist: Secondary | ICD-10-CM | POA: Diagnosis not present

## 2023-08-08 ENCOUNTER — Ambulatory Visit (AMBULATORY_SURGERY_CENTER): Payer: BC Managed Care – PPO

## 2023-08-08 VITALS — Ht 67.0 in | Wt 220.0 lb

## 2023-08-08 DIAGNOSIS — Z860100 Personal history of colon polyps, unspecified: Secondary | ICD-10-CM

## 2023-08-08 MED ORDER — NA SULFATE-K SULFATE-MG SULF 17.5-3.13-1.6 GM/177ML PO SOLN: 1.0000 | Freq: Once | ORAL | 0 refills | Status: AC

## 2023-08-08 NOTE — Progress Notes (Signed)

## 2023-08-23 ENCOUNTER — Encounter: Payer: BC Managed Care – PPO | Admitting: Internal Medicine

## 2023-10-03 ENCOUNTER — Encounter: Payer: BC Managed Care – PPO | Admitting: Internal Medicine

## 2023-10-27 ENCOUNTER — Ambulatory Visit (AMBULATORY_SURGERY_CENTER): Payer: BC Managed Care – PPO

## 2023-10-27 VITALS — Ht 67.0 in | Wt 215.0 lb

## 2023-10-27 DIAGNOSIS — Z860100 Personal history of colon polyps, unspecified: Secondary | ICD-10-CM

## 2023-10-27 NOTE — Progress Notes (Signed)

## 2023-10-28 ENCOUNTER — Telehealth: Payer: Self-pay | Admitting: Internal Medicine

## 2023-10-28 MED ORDER — NA SULFATE-K SULFATE-MG SULF 17.5-3.13-1.6 GM/177ML PO SOLN: 1.0000 | Freq: Once | ORAL | 0 refills | Status: AC

## 2023-10-28 NOTE — Addendum Note (Signed)
 Addended by: Mason Jim on: 10/28/2023 02:21 PM   Modules accepted: Orders

## 2023-10-28 NOTE — Telephone Encounter (Signed)
 Inbound call from patient requesting prep medication sent [facility] in [place] on [address]. Please advise.   Thank you

## 2023-10-28 NOTE — Telephone Encounter (Signed)
 [facility] requested

## 2023-11-08 ENCOUNTER — Encounter: Payer: BC Managed Care – PPO | Admitting: Internal Medicine

## 2023-11-11 ENCOUNTER — Encounter: Payer: Self-pay | Admitting: Internal Medicine

## 2023-11-11 NOTE — Telephone Encounter (Signed)
 Patient states he had 2 handfuls of soft fluffy popcorn. Please advise. Scheduled for 4/7.

## 2023-11-11 NOTE — Telephone Encounter (Signed)
 Returned call to patient . Instructed patient to increase water intake and avoid any high fiber food listed on hs instructions. Patient agreed and verbalized understanding.

## 2023-11-14 ENCOUNTER — Encounter: Payer: Self-pay | Admitting: Internal Medicine

## 2023-11-14 ENCOUNTER — Ambulatory Visit (AMBULATORY_SURGERY_CENTER): Admitting: Internal Medicine

## 2023-11-14 VITALS — BP 136/83 | HR 88 | Temp 98.0°F | Resp 17 | Ht 67.0 in | Wt 215.0 lb

## 2023-11-14 DIAGNOSIS — K648 Other hemorrhoids: Secondary | ICD-10-CM | POA: Diagnosis not present

## 2023-11-14 DIAGNOSIS — Z1211 Encounter for screening for malignant neoplasm of colon: Secondary | ICD-10-CM | POA: Diagnosis present

## 2023-11-14 DIAGNOSIS — Z860101 Personal history of adenomatous and serrated colon polyps: Secondary | ICD-10-CM

## 2023-11-14 DIAGNOSIS — Z860100 Personal history of colon polyps, unspecified: Secondary | ICD-10-CM

## 2023-11-14 DIAGNOSIS — K5730 Diverticulosis of large intestine without perforation or abscess without bleeding: Secondary | ICD-10-CM | POA: Diagnosis not present

## 2023-11-14 MED ORDER — SODIUM CHLORIDE 0.9 % IV SOLN: 500.0000 mL | Freq: Once | INTRAVENOUS | Status: AC

## 2023-11-14 NOTE — Progress Notes (Signed)
 Pt resting comfortably. VSS. Airway intact. SBAR complete to RN. All questions answered.

## 2023-11-14 NOTE — Progress Notes (Signed)
 Pt's states no medical or surgical changes since previsit or office visit.

## 2023-11-14 NOTE — Op Note (Signed)
 [facility] Patient Name: Eddie Wu Procedure Date: 11/14/2023 1:03 PM MRN: [id number] Endoscopist: Wilhemina Bonito. Marina Goodell , MD, [id number] Age: 53 Referring MD:  Date of Birth: 01-31-1971 Gender: Male Account #: [id number] Procedure:                Colonoscopy Indications:              High risk colon cancer surveillance: Personal                            history of non-advanced adenoma (May 2019, Dr.                            Russella Dar) Medicines:                Monitored Anesthesia Care Procedure:                Pre-Anesthesia Assessment:                           - Prior to the procedure, a History and Physical                            was performed, and patient medications and                            allergies were reviewed. The patient's tolerance of                            previous anesthesia was also reviewed. The risks                            and benefits of the procedure and the sedation                            options and risks were discussed with the patient.                            All questions were answered, and informed consent                            was obtained. Prior Anticoagulants: The patient has                            taken no anticoagulant or antiplatelet agents. ASA                            Grade Assessment: II - A patient with mild systemic                            disease. After reviewing the risks and benefits,                            the patient was deemed in satisfactory condition to  undergo the procedure.                           After obtaining informed consent, the colonoscope                            was passed under direct vision. Throughout the                            procedure, the patient's blood pressure, pulse, and                            oxygen saturations were monitored continuously. The                            CF [id number] #[id number] was introduced through the anus                             and advanced to the the cecum, identified by                            appendiceal orifice and ileocecal valve. The                            ileocecal valve, appendiceal orifice, and rectum                            were photographed. The quality of the bowel                            preparation was excellent. The colonoscopy was                            performed without difficulty. The patient tolerated                            the procedure well. The bowel preparation used was                            SUPREP via split dose instruction. Scope In: 1:35:38 PM Scope Out: 1:44:45 PM Scope Withdrawal Time: 0 hours 7 minutes 24 seconds  Total Procedure Duration: 0 hours 9 minutes 7 seconds  Findings:                 Multiple diverticula were found in the entire                            colon. Internal hemorrhoids.                           The exam was otherwise without abnormality on                            direct and retroflexion views. Complications:            No immediate  complications. Estimated blood loss:                            None. Estimated Blood Loss:     Estimated blood loss: none. Impression:               - Diverticulosis in the entire examined colon.                            Internal hemorrhoids.                           - The examination was otherwise normal on direct                            and retroflexion views.                           - No specimens collected. Recommendation:           - Repeat colonoscopy in 10 years for surveillance.                           - Patient has a contact number available for                            emergencies. The signs and symptoms of potential                            delayed complications were discussed with the                            patient. Return to normal activities tomorrow.                            Written discharge instructions were provided to the                             patient.                           - Resume previous diet.                           - Continue present medications. Wilhemina Bonito. Marina Goodell, MD 11/14/2023 1:51:50 PM This report has been signed electronically.

## 2023-11-14 NOTE — Patient Instructions (Signed)
   Handout on diverticulosis given to you today  Continue previous diet & medications   YOU HAD AN ENDOSCOPIC PROCEDURE TODAY AT THE [facility]:   Refer to the procedure report that was given to you for any specific questions about what was found during the examination.  If the procedure report does not answer your questions, please call your gastroenterologist to clarify.  If you requested that your care partner not be given the details of your procedure findings, then the procedure report has been included in a sealed envelope for you to review at your convenience later.  YOU SHOULD EXPECT: Some feelings of bloating in the abdomen. Passage of more gas than usual.  Walking can help get rid of the air that was put into your GI tract during the procedure and reduce the bloating. If you had a lower endoscopy (such as a colonoscopy or flexible sigmoidoscopy) you may notice spotting of blood in your stool or on the toilet paper. If you underwent a bowel prep for your procedure, you may not have a normal bowel movement for a few days.  Please Note:  You might notice some irritation and congestion in your nose or some drainage.  This is from the oxygen used during your procedure.  There is no need for concern and it should clear up in a day or so.  SYMPTOMS TO REPORT IMMEDIATELY:  Following lower endoscopy (colonoscopy or flexible sigmoidoscopy):  Excessive amounts of blood in the stool  Significant tenderness or worsening of abdominal pains  Swelling of the abdomen that is new, acute  Fever of 100F or higher    For urgent or emergent issues, a gastroenterologist can be reached at any hour by calling [phone]. Do not use MyChart messaging for urgent concerns.    DIET:  We do recommend a small meal at first, but then you may proceed to your regular diet.  Drink plenty of fluids but you should avoid alcoholic beverages for 24 hours.  ACTIVITY:  You should plan to take it easy  for the rest of today and you should NOT DRIVE or use heavy machinery until tomorrow (because of the sedation medicines used during the test).    FOLLOW UP: Our staff will call the number listed on your records the next business day following your procedure.  We will call around 7:15- 8:00 am to check on you and address any questions or concerns that you may have regarding the information given to you following your procedure. If we do not reach you, we will leave a message.     If any biopsies were taken you will be contacted by phone or by letter within the next 1-3 weeks.  Please call us at [phone] if you have not heard about the biopsies in 3 weeks.    SIGNATURES/CONFIDENTIALITY: You and/or your care partner have signed paperwork which will be entered into your electronic medical record.  These signatures attest to the fact that that the information above on your After Visit Summary has been reviewed and is understood.  Full responsibility of the confidentiality of this discharge information lies with you and/or your care-partner.

## 2023-11-14 NOTE — Progress Notes (Signed)
 HISTORY OF PRESENT ILLNESS:  Eddie Wu is a 53 y.o. male with a history of nonadvanced adenoma.  Presents today for surveillance colonoscopy  REVIEW OF SYSTEMS:  All non-GI ROS negative except for  Past Medical History:  Diagnosis Date   Allergy    Cataract    Diverticulitis    Hyperlipidemia    on meds   Hypertension    on meds   Obesity    Sleep apnea     Past Surgical History:  Procedure Laterality Date   COLONOSCOPY  2019   MS-MAC-suprep(good)-tics/TA x 1/ 5 yr recall   POLYPECTOMY  2019   TA x 1    Social History [name]  reports that he has never smoked. He has never used smokeless tobacco. He reports current alcohol use of about 12.0 standard drinks of alcohol per week. He reports current drug use. Drug: Marijuana.  family history is not on file.  No Known Allergies     PHYSICAL EXAMINATION: Vital signs: BP (!) 153/76   Pulse 75   Temp 98 F (36.7 C) (Temporal)   Resp 19   Ht 5\' 7"  (1.702 m)   Wt 215 lb (97.5 kg)   SpO2 98%   BMI 33.67 kg/m  General: Well-developed, well-nourished, no acute distress HEENT: Sclerae are anicteric, conjunctiva pink. Oral mucosa intact Lungs: Clear Heart: Regular Abdomen: soft, nontender, nondistended, no obvious ascites, no peritoneal signs, normal bowel sounds. No organomegaly. Extremities: No edema Psychiatric: alert and oriented x3. Cooperative     ASSESSMENT:  History of nonadvanced adenoma   PLAN:  Surveillance colonoscopy

## 2023-11-15 ENCOUNTER — Telehealth: Payer: Self-pay

## 2023-11-15 NOTE — Telephone Encounter (Signed)
  Follow up Call-     11/14/2023   12:55 PM  Call back number  Post procedure Call Back phone  # [phone]  Permission to leave phone message Yes     Patient questions:  Do you have a fever, pain , or abdominal swelling? No. Pain Score  0 *  Have you tolerated food without any problems? Yes.    Have you been able to return to your normal activities? Yes.    Do you have any questions about your discharge instructions: Diet   No. Medications  No. Follow up visit  No.  Do you have questions or concerns about your Care? No.  Actions: * If pain score is 4 or above: No action needed, pain <4.
